# Patient Record
Sex: Female | Born: 1975
Health system: Southern US, Community
[De-identification: ages and names within clinical notes are randomized; demographics above are authoritative.]

## PROBLEM LIST (undated history)

## (undated) ENCOUNTER — Inpatient Hospital Stay (HOSPITAL_COMMUNITY): Payer: Self-pay

## (undated) DIAGNOSIS — T4145XA Adverse effect of unspecified anesthetic, initial encounter: Secondary | ICD-10-CM

## (undated) DIAGNOSIS — R112 Nausea with vomiting, unspecified: Secondary | ICD-10-CM

## (undated) DIAGNOSIS — E282 Polycystic ovarian syndrome: Secondary | ICD-10-CM

## (undated) DIAGNOSIS — D649 Anemia, unspecified: Secondary | ICD-10-CM

## (undated) DIAGNOSIS — Z9889 Other specified postprocedural states: Secondary | ICD-10-CM

## (undated) DIAGNOSIS — T8859XA Other complications of anesthesia, initial encounter: Secondary | ICD-10-CM

## (undated) DIAGNOSIS — O24419 Gestational diabetes mellitus in pregnancy, unspecified control: Secondary | ICD-10-CM

## (undated) HISTORY — PX: OTHER SURGICAL HISTORY: SHX169

## (undated) HISTORY — PX: DILATION AND CURETTAGE OF UTERUS: SHX78

## (undated) HISTORY — PX: HAND SURGERY: SHX662

## (undated) HISTORY — PX: WISDOM TOOTH EXTRACTION: SHX21

---

## 2001-05-26 ENCOUNTER — Other Ambulatory Visit: Admission: RE | Admit: 2001-05-26 | Discharge: 2001-05-26 | Payer: Self-pay | Admitting: Obstetrics and Gynecology

## 2002-11-01 ENCOUNTER — Other Ambulatory Visit: Admission: RE | Admit: 2002-11-01 | Discharge: 2002-11-01 | Payer: Self-pay | Admitting: Obstetrics and Gynecology

## 2003-12-22 ENCOUNTER — Ambulatory Visit (HOSPITAL_COMMUNITY): Admission: RE | Admit: 2003-12-22 | Discharge: 2003-12-22 | Payer: Self-pay | Admitting: Obstetrics and Gynecology

## 2006-11-28 ENCOUNTER — Ambulatory Visit: Payer: Self-pay | Admitting: Family Medicine

## 2006-11-28 DIAGNOSIS — K5909 Other constipation: Secondary | ICD-10-CM

## 2007-05-13 ENCOUNTER — Emergency Department (HOSPITAL_COMMUNITY): Admission: EM | Admit: 2007-05-13 | Discharge: 2007-05-13 | Payer: Self-pay | Admitting: Emergency Medicine

## 2007-12-15 ENCOUNTER — Other Ambulatory Visit: Admission: RE | Admit: 2007-12-15 | Discharge: 2007-12-15 | Payer: Self-pay | Admitting: Gynecology

## 2008-01-09 ENCOUNTER — Emergency Department (HOSPITAL_COMMUNITY): Admission: EM | Admit: 2008-01-09 | Discharge: 2008-01-09 | Payer: Self-pay | Admitting: Emergency Medicine

## 2008-01-21 ENCOUNTER — Emergency Department (HOSPITAL_COMMUNITY): Admission: EM | Admit: 2008-01-21 | Discharge: 2008-01-21 | Payer: Self-pay | Admitting: Family Medicine

## 2008-06-24 ENCOUNTER — Emergency Department (HOSPITAL_COMMUNITY): Admission: EM | Admit: 2008-06-24 | Discharge: 2008-06-24 | Payer: Self-pay | Admitting: Family Medicine

## 2008-08-30 ENCOUNTER — Ambulatory Visit (HOSPITAL_COMMUNITY): Admission: RE | Admit: 2008-08-30 | Discharge: 2008-08-30 | Payer: Self-pay | Admitting: Gynecology

## 2008-12-23 ENCOUNTER — Ambulatory Visit (HOSPITAL_COMMUNITY): Admission: RE | Admit: 2008-12-23 | Discharge: 2008-12-23 | Payer: Self-pay | Admitting: Obstetrics and Gynecology

## 2008-12-23 ENCOUNTER — Encounter (HOSPITAL_COMMUNITY): Payer: Self-pay | Admitting: Obstetrics and Gynecology

## 2009-02-22 ENCOUNTER — Ambulatory Visit (HOSPITAL_COMMUNITY): Admission: RE | Admit: 2009-02-22 | Discharge: 2009-02-22 | Payer: Self-pay | Admitting: Family Medicine

## 2009-02-23 ENCOUNTER — Emergency Department (HOSPITAL_COMMUNITY): Admission: EM | Admit: 2009-02-23 | Discharge: 2009-02-23 | Payer: Self-pay | Admitting: Emergency Medicine

## 2009-05-19 ENCOUNTER — Emergency Department (HOSPITAL_COMMUNITY): Admission: EM | Admit: 2009-05-19 | Discharge: 2009-05-19 | Payer: Self-pay | Admitting: Family Medicine

## 2010-04-06 ENCOUNTER — Encounter (HOSPITAL_COMMUNITY)
Admission: RE | Admit: 2010-04-06 | Discharge: 2010-04-06 | Disposition: A | Discharge status: Home / Self Care | Payer: 59 | Source: Ambulatory Visit | Attending: Obstetrics and Gynecology | Admitting: Obstetrics and Gynecology

## 2010-04-06 DIAGNOSIS — Z01812 Encounter for preprocedural laboratory examination: Secondary | ICD-10-CM | POA: Insufficient documentation

## 2010-04-06 LAB — CBC
HCT: 38.6 % (ref 36.0–46.0)
Hemoglobin: 12.6 g/dL (ref 12.0–15.0)
MCH: 28 pg (ref 26.0–34.0)
MCHC: 32.6 g/dL (ref 30.0–36.0)
MCV: 85.8 fL (ref 78.0–100.0)
Platelets: 302 10*3/uL (ref 150–400)
RBC: 4.5 MIL/uL (ref 3.87–5.11)
RDW: 12.3 % (ref 11.5–15.5)
WBC: 4 10*3/uL (ref 4.0–10.5)

## 2010-04-13 ENCOUNTER — Other Ambulatory Visit (HOSPITAL_COMMUNITY): Payer: Self-pay | Admitting: Obstetrics and Gynecology

## 2010-04-13 ENCOUNTER — Ambulatory Visit (HOSPITAL_COMMUNITY)
Admission: RE | Admit: 2010-04-13 | Discharge: 2010-04-13 | Disposition: A | Discharge status: Home / Self Care | Payer: 59 | Source: Ambulatory Visit | Attending: Obstetrics and Gynecology | Admitting: Obstetrics and Gynecology

## 2010-04-13 DIAGNOSIS — N840 Polyp of corpus uteri: Secondary | ICD-10-CM | POA: Insufficient documentation

## 2010-04-13 DIAGNOSIS — N938 Other specified abnormal uterine and vaginal bleeding: Secondary | ICD-10-CM | POA: Insufficient documentation

## 2010-04-13 DIAGNOSIS — N979 Female infertility, unspecified: Secondary | ICD-10-CM | POA: Insufficient documentation

## 2010-04-13 DIAGNOSIS — N949 Unspecified condition associated with female genital organs and menstrual cycle: Secondary | ICD-10-CM | POA: Insufficient documentation

## 2010-04-13 LAB — TYPE AND SCREEN
ABO/RH(D): O POS
Antibody Screen: NEGATIVE

## 2010-04-13 LAB — PREGNANCY, URINE: Preg Test, Ur: NEGATIVE

## 2010-04-18 NOTE — Op Note (Signed)
  NAMEKATHALINA, Andrea Young              ACCOUNT NO.:  [id number]  MEDICAL RECORD NO.:  [id number]           PATIENT TYPE:  O  LOCATION:  [facility]                          [facility]  [facility]  PHYSICIAN:  Zelphia Cairo, MD    DATE OF BIRTH:  [birth year]  DATE OF PROCEDURE:  04/13/2010 DATE OF DISCHARGE:                              OPERATIVE REPORT   PREOPERATIVE DIAGNOSES: 1. Infertility. 2. Irregular bleeding.  PROCEDURES: 1. Cervical block. 2. Hysteroscopy. 3. D and C.  SURGEON:  Zelphia Cairo, MD  ANESTHESIA:  General.  SPECIMENS: 1. Endometrial polyp. 2. Endometrial curettings to Pathology.  FLUID DEFICIT:  80 mL.  COMPLICATIONS:  None.  ESTIMATED BLOOD LOSS:  Minimal.  CONDITION:  Stable to recovery room.  DESCRIPTION OF PROCEDURE:  The patient was taken to the operating room. After informed consent was obtained, she was placed in the dorsal lithotomy position using Allen stirrups, prepped and draped in sterile fashion.  An in-and-out catheter was used to drain her bladder.  Bivalve speculum was placed in the vagina and 1 mL of 1% lidocaine was injected at 12 o'clock in the cervix.  Single-tooth tenaculum was placed on the anterior lip of the cervix.  A 9 mL of 1% lidocaine plain was then used to perform a paracervical block.  The cervix was then easily dilated using Pratt dilators and the hysteroscope was inserted into the endometrial cavity.  Large polypoid mass was noted on the anterior wall of the uterus.  No other masses were visualized.  Bilateral ostia were seen and appeared normal.  Hysteroscope was then removed.  Polyp forceps were introduced and polyp was grasped and easily removed from the cavity.  The hysteroscope was reinserted and no other masses or abnormalities were noted within the cavity.  A gentle curetting was then performed.  Specimen was placed on Telfa and passed off to be sent to Pathology.  Tenaculum was removed from the cervix.  The cervix  was hemostatic.  The speculum was removed. The patient was extubated and taken to the recovery room in stable condition.     Zelphia Cairo, MD     GA/MEDQ  D:  04/13/2010  T:  04/13/2010  Job:  [id number]  Electronically Signed by Zelphia Cairo MD on 04/18/2010 02:29:09 AM

## 2010-05-23 LAB — CBC
HCT: 35.1 % — ABNORMAL LOW (ref 36.0–46.0)
Hemoglobin: 11.5 g/dL — ABNORMAL LOW (ref 12.0–15.0)
MCHC: 32.8 g/dL (ref 30.0–36.0)
MCV: 81 fL (ref 78.0–100.0)
Platelets: 276 10*3/uL (ref 150–400)
RBC: 4.33 MIL/uL (ref 3.87–5.11)
RDW: 17.5 % — ABNORMAL HIGH (ref 11.5–15.5)
WBC: 4 10*3/uL (ref 4.0–10.5)

## 2010-05-23 LAB — TYPE AND SCREEN
ABO/RH(D): O POS
Antibody Screen: NEGATIVE

## 2010-05-23 LAB — COMPREHENSIVE METABOLIC PANEL
ALT: 21 U/L (ref 0–35)
AST: 24 U/L (ref 0–37)
Albumin: 3.7 g/dL (ref 3.5–5.2)
Alkaline Phosphatase: 68 U/L (ref 39–117)
BUN: 11 mg/dL (ref 6–23)
CO2: 27 mEq/L (ref 19–32)
Calcium: 8.9 mg/dL (ref 8.4–10.5)
Chloride: 107 mEq/L (ref 96–112)
Creatinine, Ser: 0.78 mg/dL (ref 0.4–1.2)
GFR calc Af Amer: >60 mL/min (ref >60)
GFR calc non Af Amer: >60 mL/min (ref >60)
Glucose, Bld: 114 mg/dL — ABNORMAL HIGH (ref 70–99)
Potassium: 3.6 mEq/L (ref 3.5–5.1)
Sodium: 139 mEq/L (ref 135–145)
Total Bilirubin: 0.4 mg/dL (ref 0.3–1.2)
Total Protein: 7.6 g/dL (ref 6.0–8.3)

## 2010-05-23 LAB — PREGNANCY, URINE: Preg Test, Ur: NEGATIVE

## 2010-05-23 LAB — ABO/RH: ABO/RH(D): O POS

## 2010-05-29 LAB — POCT PREGNANCY, URINE: Preg Test, Ur: NEGATIVE

## 2011-12-16 ENCOUNTER — Other Ambulatory Visit: Payer: Self-pay | Admitting: Gynecology

## 2012-01-20 ENCOUNTER — Encounter (HOSPITAL_COMMUNITY): Payer: Self-pay | Admitting: *Deleted

## 2012-01-27 ENCOUNTER — Encounter (HOSPITAL_COMMUNITY): Payer: Self-pay | Admitting: Pharmacist

## 2012-02-02 NOTE — H&P (Signed)
Andrea Young is an 36 y.o. female with irregular menses. Endometrial biopsy C/W benign polypoid tissue.  Subsequent SHG showed polypoid 8 mm intracavitary mass.   Pertinent Gynecological History: Menses: flow is excessive with use of many pads or tampons on heaviest days Bleeding: dysfunctional uterine bleeding Contraception: none DES exposure: denies Blood transfusions: none Sexually transmitted diseases: no past history Previous GYN Procedures: DNC  Last mammogram: not done Date: N/A Last pap: normal Date: 2013 OB History: G0, P0   Menstrual History: Menarche age: unknown Patient's last menstrual period was 12/26/2011.    Past Medical History  Diagnosis Date  . Complication of anesthesia   . Anemia     history  . PCOS (polycystic ovarian syndrome)     treatment with metformin    Past Surgical History  Procedure Date  . Bilateral foot surgery   . Hand surgery     as a child -tendons & ligaments repair  . Wisdom tooth extraction   . Dilation and curettage of uterus     x 2 for polyp removal    History reviewed. No pertinent family history.  Social History:  reports that she has never smoked. She has never used smokeless tobacco. She reports that she does not drink alcohol or use illicit drugs.  Allergies: No Known Allergies  No prescriptions prior to admission    Review of Systems  Constitutional: Negative for fever.    Height 5' 7.5" (1.715 m), weight 192 lb (87.091 kg), last menstrual period 12/26/2011. Physical Exam  Cardiovascular: Normal rate and regular rhythm.   Respiratory: Effort normal and breath sounds normal.    No results found for this or any previous visit (from the past 24 hour(s)).  No results found.  Assessment/Plan: 36 yo G0P0 with irregular menses and polypoid mass on SHG D/W patient hysteroscopy with resesctocope, D&C. Potential risks reviewed including infection, uterine perforation and organ damage, bleeding/transfusion  HIV/Hep, DVT/PE, pneumonia, IU synechiae and secondary infertility, laparoscopy, laparotomy, recurrent polyps, persistent or recurrent abnormal bleeding. Andrea Young II,Andrea Young E 02/02/2012, 9:45 PM

## 2012-02-03 ENCOUNTER — Ambulatory Visit (HOSPITAL_COMMUNITY)
Admission: RE | Admit: 2012-02-03 | Discharge: 2012-02-03 | Disposition: A | Discharge status: Home / Self Care | Payer: 59 | Source: Ambulatory Visit | Attending: Obstetrics and Gynecology | Admitting: Obstetrics and Gynecology

## 2012-02-03 ENCOUNTER — Ambulatory Visit (HOSPITAL_COMMUNITY): Payer: 59 | Admitting: Anesthesiology

## 2012-02-03 ENCOUNTER — Encounter (HOSPITAL_COMMUNITY)
Admission: RE | Disposition: A | Discharge status: Home / Self Care | Payer: Self-pay | Source: Ambulatory Visit | Attending: Obstetrics and Gynecology

## 2012-02-03 ENCOUNTER — Encounter (HOSPITAL_COMMUNITY): Payer: Self-pay | Admitting: Anesthesiology

## 2012-02-03 DIAGNOSIS — N926 Irregular menstruation, unspecified: Secondary | ICD-10-CM | POA: Insufficient documentation

## 2012-02-03 DIAGNOSIS — N840 Polyp of corpus uteri: Secondary | ICD-10-CM | POA: Insufficient documentation

## 2012-02-03 HISTORY — DX: Adverse effect of unspecified anesthetic, initial encounter: T41.45XA

## 2012-02-03 HISTORY — PX: DILATATION & CURRETTAGE/HYSTEROSCOPY WITH RESECTOCOPE: SHX5572

## 2012-02-03 HISTORY — DX: Other complications of anesthesia, initial encounter: T88.59XA

## 2012-02-03 HISTORY — DX: Polycystic ovarian syndrome: E28.2

## 2012-02-03 HISTORY — DX: Anemia, unspecified: D64.9

## 2012-02-03 LAB — CBC
HCT: 36 % (ref 36.0–46.0)
MCHC: 33.1 g/dL (ref 30.0–36.0)
MCV: 84.7 fL (ref 78.0–100.0)
Platelets: 278 10*3/uL (ref 150–400)
RDW: 12.9 % (ref 11.5–15.5)

## 2012-02-03 LAB — BASIC METABOLIC PANEL
BUN: 12 mg/dL (ref 6–23)
Creatinine, Ser: 0.78 mg/dL (ref 0.50–1.10)
GFR calc Af Amer: >90 mL/min (ref >90)
GFR calc non Af Amer: >90 mL/min (ref >90)
Potassium: 3.8 mEq/L (ref 3.5–5.1)

## 2012-02-03 LAB — HCG, SERUM, QUALITATIVE: Preg, Serum: NEGATIVE

## 2012-02-03 SURGERY — DILATATION & CURETTAGE/HYSTEROSCOPY WITH RESECTOCOPE
Anesthesia: General | Site: Vagina | Wound class: Clean Contaminated

## 2012-02-03 MED ORDER — LIDOCAINE HCL 1 % IJ SOLN
INTRAMUSCULAR | Status: DC | PRN
  Administered 2012-02-03: 20 mL

## 2012-02-03 MED ORDER — GLYCINE 1.5 % IR SOLN
Status: DC | PRN
  Administered 2012-02-03: 3000 mL

## 2012-02-03 MED ORDER — DEXAMETHASONE SODIUM PHOSPHATE 10 MG/ML IJ SOLN
INTRAMUSCULAR | Status: AC
  Filled 2012-02-03: qty 1

## 2012-02-03 MED ORDER — FENTANYL CITRATE 0.05 MG/ML IJ SOLN: 25.0000 ug | INTRAMUSCULAR | Status: DC | PRN

## 2012-02-03 MED ORDER — KETOROLAC TROMETHAMINE 30 MG/ML IJ SOLN
INTRAMUSCULAR | Status: DC | PRN
  Administered 2012-02-03: 30 mg via INTRAVENOUS

## 2012-02-03 MED ORDER — PROPOFOL 10 MG/ML IV EMUL
INTRAVENOUS | Status: DC | PRN
  Administered 2012-02-03: 20 mg via INTRAVENOUS
  Administered 2012-02-03: 180 mg via INTRAVENOUS

## 2012-02-03 MED ORDER — ONDANSETRON HCL 4 MG/2ML IJ SOLN
INTRAMUSCULAR | Status: DC | PRN
  Administered 2012-02-03: 4 mg via INTRAVENOUS

## 2012-02-03 MED ORDER — DEXAMETHASONE SODIUM PHOSPHATE 10 MG/ML IJ SOLN
INTRAMUSCULAR | Status: DC | PRN
  Administered 2012-02-03: 10 mg via INTRAVENOUS

## 2012-02-03 MED ORDER — MIDAZOLAM HCL 2 MG/2ML IJ SOLN
INTRAMUSCULAR | Status: AC
  Filled 2012-02-03: qty 2

## 2012-02-03 MED ORDER — FENTANYL CITRATE 0.05 MG/ML IJ SOLN
INTRAMUSCULAR | Status: AC
  Filled 2012-02-03: qty 5

## 2012-02-03 MED ORDER — BUPIVACAINE HCL (PF) 0.25 % IJ SOLN: INTRAMUSCULAR | Status: DC | PRN

## 2012-02-03 MED ORDER — LIDOCAINE HCL (CARDIAC) 20 MG/ML IV SOLN
INTRAVENOUS | Status: DC | PRN
  Administered 2012-02-03: 50 mg via INTRAVENOUS

## 2012-02-03 MED ORDER — LIDOCAINE HCL (CARDIAC) 20 MG/ML IV SOLN
INTRAVENOUS | Status: AC
  Filled 2012-02-03: qty 5

## 2012-02-03 MED ORDER — LACTATED RINGERS IV SOLN
INTRAVENOUS | Status: DC
  Administered 2012-02-03 (×2): via INTRAVENOUS

## 2012-02-03 MED ORDER — FENTANYL CITRATE 0.05 MG/ML IJ SOLN
INTRAMUSCULAR | Status: DC | PRN
  Administered 2012-02-03: 100 ug via INTRAVENOUS
  Administered 2012-02-03: 50 ug via INTRAVENOUS

## 2012-02-03 MED ORDER — SCOPOLAMINE 1 MG/3DAYS TD PT72
1.0000 | MEDICATED_PATCH | TRANSDERMAL | Status: DC
  Administered 2012-02-03: 1.5 mg via TRANSDERMAL

## 2012-02-03 MED ORDER — CEFAZOLIN SODIUM-DEXTROSE 2-3 GM-% IV SOLR
INTRAVENOUS | Status: AC
  Filled 2012-02-03: qty 50

## 2012-02-03 MED ORDER — SCOPOLAMINE 1 MG/3DAYS TD PT72
MEDICATED_PATCH | TRANSDERMAL | Status: AC
  Filled 2012-02-03: qty 1

## 2012-02-03 MED ORDER — ONDANSETRON HCL 4 MG/2ML IJ SOLN
INTRAMUSCULAR | Status: AC
  Filled 2012-02-03: qty 2

## 2012-02-03 MED ORDER — PROPOFOL 10 MG/ML IV EMUL
INTRAVENOUS | Status: AC
  Filled 2012-02-03: qty 20

## 2012-02-03 MED ORDER — KETOROLAC TROMETHAMINE 30 MG/ML IJ SOLN: 15.0000 mg | Freq: Once | INTRAMUSCULAR | Status: DC | PRN

## 2012-02-03 MED ORDER — CEFAZOLIN SODIUM-DEXTROSE 2-3 GM-% IV SOLR
2.0000 g | INTRAVENOUS | Status: AC
  Administered 2012-02-03: 2 g via INTRAVENOUS

## 2012-02-03 MED ORDER — MIDAZOLAM HCL 5 MG/5ML IJ SOLN
INTRAMUSCULAR | Status: DC | PRN
  Administered 2012-02-03: 2 mg via INTRAVENOUS

## 2012-02-03 MED ORDER — KETOROLAC TROMETHAMINE 30 MG/ML IJ SOLN
INTRAMUSCULAR | Status: AC
  Filled 2012-02-03: qty 1

## 2012-02-03 SURGICAL SUPPLY — 13 items
CANISTER SUCTION 2500CC (MISCELLANEOUS) ×2 IMPLANT
CATH ROBINSON RED A/P 16FR (CATHETERS) ×2 IMPLANT
CLOTH BEACON ORANGE TIMEOUT ST (SAFETY) ×2 IMPLANT
CONTAINER PREFILL 10% NBF 60ML (FORM) ×4 IMPLANT
DRESSING TELFA 8X3 (GAUZE/BANDAGES/DRESSINGS) ×2 IMPLANT
ELECT REM PT RETURN 9FT ADLT (ELECTROSURGICAL) ×2
ELECTRODE REM PT RTRN 9FT ADLT (ELECTROSURGICAL) ×1 IMPLANT
GLOVE BIO SURGEON STRL SZ8 (GLOVE) ×4 IMPLANT
GOWN STRL REIN XL XLG (GOWN DISPOSABLE) ×4 IMPLANT
LOOP ANGLED CUTTING 22FR (CUTTING LOOP) ×1 IMPLANT
PACK HYSTEROSCOPY LF (CUSTOM PROCEDURE TRAY) ×2 IMPLANT
PAD OB MATERNITY 4.3X12.25 (PERSONAL CARE ITEMS) ×2 IMPLANT
TOWEL OR 17X24 6PK STRL BLUE (TOWEL DISPOSABLE) ×4 IMPLANT

## 2012-02-03 NOTE — Progress Notes (Signed)
D/W patient procedure. All questions answered. No changes to H&P per patient history.

## 2012-02-03 NOTE — Brief Op Note (Signed)
02/03/2012  8:00 AM  PATIENT:  Massie Maroon  36 y.o. female  PRE-OPERATIVE DIAGNOSIS:  irregular menses  POST-OPERATIVE DIAGNOSIS:  irregular menses  PROCEDURE:  Hysteroscopy, dilation and currettage  SURGEON:  Surgeon(s) and Role:    * Leslie Andrea, MD - Primary  PHYSICIAN ASSISTANT:   ASSISTANTS: none   ANESTHESIA:   general  EBL:  Total I/O In: 600 [I.V.:600] Out: 210 [Urine:200; Blood:10]  BLOOD ADMINISTERED:none  DRAINS: none   LOCAL MEDICATIONS USED:  LIDOCAINE   SPECIMEN:  Source of Specimen:  endometrial currettings  DISPOSITION OF SPECIMEN:  PATHOLOGY  COUNTS:  YES  TOURNIQUET:  * No tourniquets in log *  DICTATION: .Other Dictation: Dictation Number [id number]  PLAN OF CARE: Discharge to home after PACU  PATIENT DISPOSITION:  PACU - hemodynamically stable.   Delay start of Pharmacological VTE agent (>24hrs) due to surgical blood loss or risk of bleeding: not applicable

## 2012-02-03 NOTE — Op Note (Signed)
NAMEKAYDIN, LABO              ACCOUNT NO.:  [id number]  MEDICAL RECORD NO.:  [id number]  LOCATION:  [facility]                          [facility]ACILITY:  [facility]  PHYSICIAN:  Guy Sandifer. Henderson Cloud, M.D. DATE OF BIRTH:  October 09, 1975  DATE OF PROCEDURE:  02/03/2012 DATE OF DISCHARGE:                              OPERATIVE REPORT   PREOPERATIVE DIAGNOSIS:  Irregular menses.  POSTOPERATIVE DIAGNOSIS:  Irregular menses.  PROCEDURE:  Hysteroscopy with dilatation and  curettage.  SURGEON:  Guy Sandifer. Henderson Cloud, M.D.  ANESTHESIA:  General with LMA, Dana Allan, MD  SPECIMENS:  Endometrial curettings to pathology.  ESTIMATED BLOOD LOSS:  Less than 100 mL.  I's and O's of distending media 110 mL deficit.  INDICATIONS AND CONSENT:  This patient is a 36 year old married black female, G0 P0, with irregular menses.  Ultrasound has been consistent with endometrial polypoid structure approximately 8 mm in size. Hysteroscopy with  resectoscope, dilatation,  curettage has been discussed.  Potential risks and complications have been reviewed preoperatively including but not limited to infection, uterine perforation, organ damage, bleeding requiring transfusion of blood products with HIV and hepatitis acquisition, DVT, PE, pneumonia, laparoscopy,  laparotomy, persistent or recurrent abnormal bleeding, intrauterine sneak and secondary infertility.  All questions have been answered and consent is signed on the chart.  FINDINGS:  There is an 8-10 mm polypoid type structure in the lower uterine segment just above the internal cervical os.  The lining of the uterine cavity is thick.  Fallopian tube, ostia identified bilaterally.  PROCEDURE IN DETAIL:  The patient was taken to the operating room where she was identified, placed in dorsal supine position.  General anesthesia was induced via LMA.  She was  placed in a dorsal lithotomy position.  Time-out was  undertaken.  She was prepped,  bladder straight catheterized  and draped in sterile fashion.  Bivalve speculum was placed in the vagina.  Anterior cervical lip was injected with 1% plain Xylocaine and grasped with single-tooth tenaculum.  Paracervical block was placed at 2, 4, 5, 7, 8, 10 o'clock positions with approximately 20 mL of the same solution.  Cervix was gently progressively dilated. Diagnostic hysteroscope was placed and advanced under direct visualization using distending media.  The above findings were noted. Hysteroscope was withdrawn and sharp curettage was carried out.  The polyp was seem to be obtained as well.  After curettage reinspection with the hysteroscope reveals the cavity to be clean.  Instruments were removed.  All counts were  correct.  Good hemostasis was noted, and the patient was awakened, taken to recovery room in stable condition.     Guy Sandifer Henderson Cloud, M.D.     JET/MEDQ  D:  02/03/2012  T:  02/03/2012  Job:  [id number]

## 2012-02-03 NOTE — Anesthesia Postprocedure Evaluation (Signed)
Anesthesia Post Note  Patient: Andrea Young  Procedure(s) Performed: Procedure(s) (LRB): DILATATION & CURETTAGE/HYSTEROSCOPY WITH RESECTOCOPE (N/A)  Anesthesia type: General  Patient location: PACU  Post pain: Pain level controlled  Post assessment: Post-op Vital signs reviewed  Last Vitals:  Filed Vitals:   02/03/12 0830  BP: 99/53  Pulse: 45  Temp:   Resp: 10    Post vital signs: Reviewed  Level of consciousness: sedated  Complications: No apparent anesthesia complicationsfj

## 2012-02-03 NOTE — Transfer of Care (Signed)
Immediate Anesthesia Transfer of Care Note  Patient: Andrea Young  Procedure(s) Performed: Procedure(s) (LRB) with comments: DILATATION & CURETTAGE/HYSTEROSCOPY WITH RESECTOCOPE (N/A) - no resection done  Patient Location: PACU  Anesthesia Type:General  Level of Consciousness: sedated  Airway & Oxygen Therapy: Patient Spontanous Breathing and Patient connected to nasal cannula oxygen  Post-op Assessment: Report given to PACU RN  Post vital signs: Reviewed and stable  Complications: No apparent anesthesia complications

## 2012-02-03 NOTE — Anesthesia Preprocedure Evaluation (Signed)
Anesthesia Evaluation  Patient identified by MRN, date of birth, ID band Patient awake    Reviewed: Allergy & Precautions, H&P , NPO status , Patient's Chart, lab work & pertinent test results, reviewed documented beta blocker date and time   History of Anesthesia Complications (+) PONV  Airway Mallampati: II TM Distance: >3 FB Neck ROM: full    Dental  (+) Teeth Intact   Pulmonary neg pulmonary ROS,  breath sounds clear to auscultation  Pulmonary exam normal       Cardiovascular negative cardio ROS  Rhythm:regular Rate:Normal     Neuro/Psych negative neurological ROS  negative psych ROS   GI/Hepatic negative GI ROS, Neg liver ROS,   Endo/Other  PCOS  Renal/GU negative Renal ROS  Female GU complaint     Musculoskeletal   Abdominal   Peds  Hematology negative hematology ROS (+)   Anesthesia Other Findings   Reproductive/Obstetrics negative OB ROS                           Anesthesia Physical Anesthesia Plan  ASA: II  Anesthesia Plan: General LMA   Post-op Pain Management:    Induction:   Airway Management Planned:   Additional Equipment:   Intra-op Plan:   Post-operative Plan:   Informed Consent: I have reviewed the patients History and Physical, chart, labs and discussed the procedure including the risks, benefits and alternatives for the proposed anesthesia with the patient or authorized representative who has indicated his/her understanding and acceptance.   Dental Advisory Given  Plan Discussed with: CRNA and Surgeon  Anesthesia Plan Comments:         Anesthesia Quick Evaluation

## 2012-02-04 ENCOUNTER — Encounter (HOSPITAL_COMMUNITY): Payer: Self-pay | Admitting: Obstetrics and Gynecology

## 2013-11-08 ENCOUNTER — Other Ambulatory Visit (HOSPITAL_COMMUNITY): Payer: Self-pay | Admitting: Gynecology

## 2013-11-08 DIAGNOSIS — Z3141 Encounter for fertility testing: Secondary | ICD-10-CM

## 2013-11-16 ENCOUNTER — Ambulatory Visit (HOSPITAL_COMMUNITY)
Admission: RE | Admit: 2013-11-16 | Discharge: 2013-11-16 | Disposition: A | Discharge status: Home / Self Care | Payer: 59 | Source: Ambulatory Visit | Attending: Gynecology | Admitting: Gynecology

## 2013-11-16 DIAGNOSIS — Z3141 Encounter for fertility testing: Secondary | ICD-10-CM

## 2013-11-16 DIAGNOSIS — N979 Female infertility, unspecified: Secondary | ICD-10-CM | POA: Diagnosis present

## 2013-11-16 MED ORDER — IOHEXOL 300 MG/ML  SOLN: 5.0000 mL | Freq: Once | INTRAMUSCULAR | Status: AC | PRN

## 2013-11-29 ENCOUNTER — Encounter (HOSPITAL_COMMUNITY): Payer: Self-pay | Admitting: Pharmacist

## 2013-11-29 ENCOUNTER — Encounter (HOSPITAL_COMMUNITY): Payer: Self-pay | Admitting: *Deleted

## 2013-12-01 MED ORDER — CEFOTETAN DISODIUM 2 G IJ SOLR
2.0000 g | INTRAMUSCULAR | Status: AC
  Administered 2013-12-02: 2 g via INTRAVENOUS
  Filled 2013-12-01: qty 2

## 2013-12-01 NOTE — H&P (Addendum)
Andrea Young is an 38 y.o. female with endometrial mass and infertility presents for surgical mngt.    Patient's last menstrual period was 11/06/2013.    Past Medical History  Diagnosis Date  . Complication of anesthesia   . Anemia     history  . PCOS (polycystic ovarian syndrome)     treatment with metformin  . PONV (postoperative nausea and vomiting)     Past Surgical History  Procedure Laterality Date  . Bilateral foot surgery    . Hand surgery      as a child -tendons & ligaments repair  . Wisdom tooth extraction    . Dilation and curettage of uterus      x 2 for polyp removal  . Dilatation & currettage/hysteroscopy with resectocope  02/03/2012    Procedure: DILATATION & CURETTAGE/HYSTEROSCOPY WITH RESECTOCOPE;  Surgeon: Leslie AndreaJames E Tomblin II, MD;  Location: [facility];  Service: Gynecology;  Laterality: N/A;  no resection done    No family history on file.  Social History:  reports that she has never smoked. She has never used smokeless tobacco. She reports that she does not drink alcohol or use illicit drugs.  Allergies: No Known Allergies  No prescriptions prior to admission    ROS  Last menstrual period 11/06/2013. Physical Exam AF, VSS Gen - NAD CV - RRR Lungs - clear Abd - soft, NT/ND Ext - NT PV - uterus mobile NT.  No adnexal tenderness  HSG - continues to have filling defect in left cornual on HSG.    Assessment/Plan: Infertiltiy and recurrent endometrial mass Hysteroscopy, D&C, resection of endometrial mass R/b/a discussed, questions answered, informed consent  [name] 12/01/2013, 6:56 PM

## 2013-12-02 ENCOUNTER — Encounter (HOSPITAL_COMMUNITY)
Admission: RE | Disposition: A | Discharge status: Home / Self Care | Payer: Self-pay | Source: Ambulatory Visit | Attending: Obstetrics and Gynecology

## 2013-12-02 ENCOUNTER — Ambulatory Visit (HOSPITAL_COMMUNITY)
Admission: RE | Admit: 2013-12-02 | Discharge: 2013-12-02 | Disposition: A | Discharge status: Home / Self Care | Payer: 59 | Source: Ambulatory Visit | Attending: Obstetrics and Gynecology | Admitting: Obstetrics and Gynecology

## 2013-12-02 ENCOUNTER — Encounter (HOSPITAL_COMMUNITY): Payer: 59 | Admitting: Anesthesiology

## 2013-12-02 ENCOUNTER — Ambulatory Visit (HOSPITAL_COMMUNITY): Payer: 59 | Admitting: Anesthesiology

## 2013-12-02 DIAGNOSIS — E282 Polycystic ovarian syndrome: Secondary | ICD-10-CM | POA: Diagnosis not present

## 2013-12-02 DIAGNOSIS — N840 Polyp of corpus uteri: Secondary | ICD-10-CM | POA: Diagnosis present

## 2013-12-02 HISTORY — DX: Other specified postprocedural states: R11.2

## 2013-12-02 HISTORY — DX: Other specified postprocedural states: Z98.890

## 2013-12-02 LAB — CBC
HCT: 37.2 % (ref 36.0–46.0)
HEMOGLOBIN: 12.6 g/dL (ref 12.0–15.0)
MCH: 28.7 pg (ref 26.0–34.0)
MCHC: 33.9 g/dL (ref 30.0–36.0)
MCV: 84.7 fL (ref 78.0–100.0)
Platelets: 245 10*3/uL (ref 150–400)
RBC: 4.39 MIL/uL (ref 3.87–5.11)
RDW: 13.1 % (ref 11.5–15.5)
WBC: 3.7 10*3/uL — ABNORMAL LOW (ref 4.0–10.5)

## 2013-12-02 LAB — PREGNANCY, URINE: Preg Test, Ur: NEGATIVE

## 2013-12-02 SURGERY — DILATATION & CURETTAGE/HYSTEROSCOPY WITH MYOSURE
Anesthesia: General

## 2013-12-02 MED ORDER — FENTANYL CITRATE 0.05 MG/ML IJ SOLN: 25.0000 ug | INTRAMUSCULAR | Status: DC | PRN

## 2013-12-02 MED ORDER — LIDOCAINE HCL 1 % IJ SOLN
INTRAMUSCULAR | Status: DC | PRN
  Administered 2013-12-02: 10 mL

## 2013-12-02 MED ORDER — ONDANSETRON HCL 4 MG/2ML IJ SOLN
INTRAMUSCULAR | Status: AC
  Filled 2013-12-02: qty 2

## 2013-12-02 MED ORDER — MIDAZOLAM HCL 2 MG/2ML IJ SOLN: 0.5000 mg | Freq: Once | INTRAMUSCULAR | Status: DC | PRN

## 2013-12-02 MED ORDER — FENTANYL CITRATE 0.05 MG/ML IJ SOLN
INTRAMUSCULAR | Status: DC | PRN
  Administered 2013-12-02: 100 ug via INTRAVENOUS

## 2013-12-02 MED ORDER — SCOPOLAMINE 1 MG/3DAYS TD PT72
MEDICATED_PATCH | TRANSDERMAL | Status: AC
  Administered 2013-12-02: 1.5 mg via TRANSDERMAL
  Filled 2013-12-02: qty 1

## 2013-12-02 MED ORDER — MIDAZOLAM HCL 2 MG/2ML IJ SOLN
INTRAMUSCULAR | Status: DC | PRN
  Administered 2013-12-02: 2 mg via INTRAVENOUS

## 2013-12-02 MED ORDER — LIDOCAINE HCL (CARDIAC) 20 MG/ML IV SOLN
INTRAVENOUS | Status: AC
  Filled 2013-12-02: qty 5

## 2013-12-02 MED ORDER — LACTATED RINGERS IV SOLN: INTRAVENOUS | Status: DC

## 2013-12-02 MED ORDER — FENTANYL CITRATE 0.05 MG/ML IJ SOLN
INTRAMUSCULAR | Status: AC
  Filled 2013-12-02: qty 5

## 2013-12-02 MED ORDER — SCOPOLAMINE 1 MG/3DAYS TD PT72: 1.0000 | MEDICATED_PATCH | Freq: Once | TRANSDERMAL | Status: DC

## 2013-12-02 MED ORDER — LACTATED RINGERS IV SOLN
INTRAVENOUS | Status: DC
  Administered 2013-12-02: 09:00:00 via INTRAVENOUS

## 2013-12-02 MED ORDER — PROPOFOL 10 MG/ML IV EMUL
INTRAVENOUS | Status: AC
  Filled 2013-12-02: qty 20

## 2013-12-02 MED ORDER — MEPERIDINE HCL 25 MG/ML IJ SOLN: 6.2500 mg | INTRAMUSCULAR | Status: DC | PRN

## 2013-12-02 MED ORDER — LIDOCAINE HCL (CARDIAC) 20 MG/ML IV SOLN
INTRAVENOUS | Status: DC | PRN
  Administered 2013-12-02: 50 mg via INTRAVENOUS

## 2013-12-02 MED ORDER — HYDROCODONE-IBUPROFEN 7.5-200 MG PO TABS: 1.0000 | ORAL_TABLET | Freq: Three times a day (TID) | ORAL | Status: DC | PRN

## 2013-12-02 MED ORDER — LIDOCAINE HCL 1 % IJ SOLN
INTRAMUSCULAR | Status: AC
  Filled 2013-12-02: qty 20

## 2013-12-02 MED ORDER — KETOROLAC TROMETHAMINE 30 MG/ML IJ SOLN
INTRAMUSCULAR | Status: AC
  Filled 2013-12-02: qty 1

## 2013-12-02 MED ORDER — ONDANSETRON HCL 4 MG/2ML IJ SOLN
INTRAMUSCULAR | Status: DC | PRN
  Administered 2013-12-02: 4 mg via INTRAVENOUS

## 2013-12-02 MED ORDER — SCOPOLAMINE 1 MG/3DAYS TD PT72
1.0000 | MEDICATED_PATCH | Freq: Once | TRANSDERMAL | Status: DC
  Administered 2013-12-02: 1.5 mg via TRANSDERMAL

## 2013-12-02 MED ORDER — GLYCOPYRROLATE 0.2 MG/ML IJ SOLN
INTRAMUSCULAR | Status: DC | PRN
  Administered 2013-12-02: 0.2 mg via INTRAVENOUS

## 2013-12-02 MED ORDER — DEXAMETHASONE SODIUM PHOSPHATE 4 MG/ML IJ SOLN
INTRAMUSCULAR | Status: AC
  Filled 2013-12-02: qty 1

## 2013-12-02 MED ORDER — PROMETHAZINE HCL 25 MG/ML IJ SOLN: 6.2500 mg | INTRAMUSCULAR | Status: DC | PRN

## 2013-12-02 MED ORDER — DEXAMETHASONE SODIUM PHOSPHATE 10 MG/ML IJ SOLN
INTRAMUSCULAR | Status: DC | PRN
  Administered 2013-12-02: 4 mg via INTRAVENOUS

## 2013-12-02 MED ORDER — KETOROLAC TROMETHAMINE 30 MG/ML IJ SOLN
INTRAMUSCULAR | Status: DC | PRN
  Administered 2013-12-02: 30 mg via INTRAVENOUS

## 2013-12-02 MED ORDER — KETOROLAC TROMETHAMINE 30 MG/ML IJ SOLN: 15.0000 mg | Freq: Once | INTRAMUSCULAR | Status: DC | PRN

## 2013-12-02 MED ORDER — PROPOFOL 10 MG/ML IV BOLUS
INTRAVENOUS | Status: DC | PRN
  Administered 2013-12-02: 200 mg via INTRAVENOUS

## 2013-12-02 MED ORDER — MIDAZOLAM HCL 2 MG/2ML IJ SOLN
INTRAMUSCULAR | Status: AC
  Filled 2013-12-02: qty 2

## 2013-12-02 SURGICAL SUPPLY — 25 items
ABLATOR ENDOMETRIAL BIPOLAR (ABLATOR) IMPLANT
CANISTER SUCT 3000ML (MISCELLANEOUS) ×3 IMPLANT
CATH ROBINSON RED A/P 16FR (CATHETERS) ×3 IMPLANT
CATH THERMACHOICE III (CATHETERS) IMPLANT
CLOTH BEACON ORANGE TIMEOUT ST (SAFETY) ×3 IMPLANT
CONTAINER PREFILL 10% NBF 60ML (FORM) ×6 IMPLANT
DEVICE MYOSURE CLASSIC (MISCELLANEOUS) IMPLANT
DEVICE MYOSURE LITE (MISCELLANEOUS) IMPLANT
DRAPE HYSTEROSCOPY (DRAPE) ×3 IMPLANT
ELECT REM PT RETURN 9FT ADLT (ELECTROSURGICAL) ×3
ELECTRODE REM PT RTRN 9FT ADLT (ELECTROSURGICAL) ×1 IMPLANT
FILTER ARTHROSCOPY CONVERTOR (FILTER) ×3 IMPLANT
GLOVE BIO SURGEON STRL SZ 6.5 (GLOVE) ×2 IMPLANT
GLOVE BIO SURGEONS STRL SZ 6.5 (GLOVE) ×1
GLOVE BIOGEL PI IND STRL 7.0 (GLOVE) ×1 IMPLANT
GLOVE BIOGEL PI INDICATOR 7.0 (GLOVE) ×2
GOWN STRL REUS W/TWL LRG LVL3 (GOWN DISPOSABLE) ×6 IMPLANT
LOOP ANGLED CUTTING 22FR (CUTTING LOOP) ×3 IMPLANT
PACK VAGINAL MINOR WOMEN LF (CUSTOM PROCEDURE TRAY) ×3 IMPLANT
PAD OB MATERNITY 4.3X12.25 (PERSONAL CARE ITEMS) ×3 IMPLANT
SEAL ROD LENS SCOPE MYOSURE (ABLATOR) ×3 IMPLANT
SET TUBING HYSTEROSCOPY 2 NDL (TUBING) IMPLANT
TOWEL OR 17X24 6PK STRL BLUE (TOWEL DISPOSABLE) ×6 IMPLANT
TUBE HYSTEROSCOPY W Y-CONNECT (TUBING) IMPLANT
WATER STERILE IRR 1000ML POUR (IV SOLUTION) ×3 IMPLANT

## 2013-12-02 NOTE — Op Note (Signed)
NAMJulianne Handler:  Steelman, Koren              ACCOUNT NO.:  [id number]  MEDICAL RECORD NO.:  [id number]  LOCATION:  [facility]                          [facility]  PHYSICIAN:  Zelphia CairoGretchen Breyer Tejera, MD    DATE OF BIRTH:  March 08, 1975  DATE OF PROCEDURE:  12/02/2013 DATE OF DISCHARGE:                              OPERATIVE REPORT   PREOPERATIVE DIAGNOSES: 1. Endometrial mass. 2. Infertility.  POSTOPERATIVE DIAGNOSES: 1. Endometrial mass. 2. Infertility, pathology pending.  PROCEDURE: 1. Paracervical block. 2. Hysteroscopy. 3. Resection of endometrial polyp.  SURGEON:  Zelphia CairoGretchen Johnnisha Forton, MD  ANESTHESIA:  General.  COMPLICATIONS:  None.  SPECIMEN:  Endometrial polyp.  CONDITION:  Stable to recovery room.  DESCRIPTION OF PROCEDURE:  The patient was taken to the operating room, where she was placed on dorsal lithotomy position using Allen stirrups. She was given general anesthesia.  Prepped and draped in sterile fashion.  In and out catheter was used to drain her bladder for an unmeasured amount of urine.  Bivalve speculum was placed in the vagina and a paracervical block was performed.  Single-tooth tenaculum was attached to the anterior lip of the cervix.  Pratt dilators was used to dilate the cervix and the hysteroscope was inserted.  Polypoid mass was noted in the left cornual region.  MyoSure was inserted and the polyp was resected to the base.  No other abnormalities were noted throughout the cavity.  All instruments were removed.  Tenaculum was removed from the cervix.  The cervix was hemostatic.  Speculum was removed.  She was extubated and taken to the recovery room in stable condition.  Sponge, lap, needle, and instrument counts were correct x2.     Zelphia CairoGretchen Destiny Trickey, MD     GA/MEDQ  D:  12/02/2013  T:  12/02/2013  Job:  [id number]

## 2013-12-02 NOTE — Transfer of Care (Signed)
Immediate Anesthesia Transfer of Care Note  Patient: Andrea Young  Procedure(s) Performed: Procedure(s): DILATATION & CURETTAGE/HYSTEROSCOPY WITH MYOSURE ABLATION (N/A)  Patient Location: PACU  Anesthesia Type:General  Level of Consciousness: awake, alert , oriented and patient cooperative  Airway & Oxygen Therapy: Patient Spontanous Breathing and Patient connected to nasal cannula oxygen  Post-op Assessment: Report given to PACU RN and Post -op Vital signs reviewed and stable  Post vital signs: Reviewed and stable  Complications: No apparent anesthesia complications

## 2013-12-02 NOTE — Anesthesia Preprocedure Evaluation (Signed)
Anesthesia Evaluation  Patient identified by MRN, date of birth, ID band Patient awake    Reviewed: Allergy & Precautions, H&P , Patient's Chart, lab work & pertinent test results, reviewed documented beta blocker date and time   History of Anesthesia Complications (+) PONV and history of anesthetic complications  Airway Mallampati: II TM Distance: >3 FB Neck ROM: full    Dental   Pulmonary  breath sounds clear to auscultation        Cardiovascular Exercise Tolerance: Good Rhythm:regular Rate:Normal     Neuro/Psych negative psych ROS   GI/Hepatic   Endo/Other    Renal/GU      Musculoskeletal   Abdominal   Peds  Hematology  (+) anemia ,   Anesthesia Other Findings   Reproductive/Obstetrics                           Anesthesia Physical Anesthesia Plan  ASA: II  Anesthesia Plan: General LMA   Post-op Pain Management:    Induction:   Airway Management Planned:   Additional Equipment:   Intra-op Plan:   Post-operative Plan:   Informed Consent: I have reviewed the patients History and Physical, chart, labs and discussed the procedure including the risks, benefits and alternatives for the proposed anesthesia with the patient or authorized representative who has indicated his/her understanding and acceptance.   Dental Advisory Given  Plan Discussed with: CRNA, Surgeon and Anesthesiologist  Anesthesia Plan Comments:         Anesthesia Quick Evaluation

## 2013-12-02 NOTE — Discharge Instructions (Signed)
[facility] office 2-3 weeks for postop appointment.  Call the office [phone] for an appointment.  MAY TAKE IBUPROFEN (MOTRIN, ADVIL) OR ALEVE AFTER 5:00 PM FOR CRAMPS!!!  Personal Hygiene: Use pads not tampons x 1week You may shower, no tub baths or pools for 2-3 weeks Wipe from front to back when using restroom  Activity: Do not drive or operate any equipment for 24 hrs.   Do not rest in bed all day Walking is encouraged Walk up and down stairs slowly You may return to your normal activity in 1-2 days  Sexual Activity:  No intercourse for 2 weeks after the procedure.  Diet: Eat a light meal as desired this evening.  You may resume your usual diet tomorrow.  Return to work:  You may resume your work activities after 1-2 days  What to expect:  Expect to have vaginal bleeding/discharge for 2-3 days and spotting for 10-14 days.  It is not unusual to have soreness for 1-2 weeks.  You may have a slight burning sensation when you urinate for the first few days.  You may start your menses in 2-6 weeks.  Mild cramps may continue for a couple of days.    Call your doctor:   Excessive bleeding, saturating a pad every hour Inability to urinate 6 hours after discharge Pain not relieved with pain medications Fever of 100.4 or greater

## 2013-12-02 NOTE — Anesthesia Postprocedure Evaluation (Signed)
  Anesthesia Post Note  Patient: Andrea MaroonLachina M Macbride  Procedure(s) Performed: Procedure(s) (LRB): DILATATION & CURETTAGE/HYSTEROSCOPY WITH MYOSURE ABLATION (N/A)  Anesthesia type: GA  Patient location: PACU  Post pain: Pain level controlled  Post assessment: Post-op Vital signs reviewed  Last Vitals:  Filed Vitals:   12/02/13 1145  BP: 100/61  Pulse: 50  Temp:   Resp: 12    Post vital signs: Reviewed  Level of consciousness: sedated  Complications: No apparent anesthesia complications

## 2013-12-12 ENCOUNTER — Emergency Department (HOSPITAL_COMMUNITY)
Admission: EM | Admit: 2013-12-12 | Discharge: 2013-12-12 | Disposition: A | Discharge status: Home / Self Care | Payer: 59 | Source: Home / Self Care | Attending: Family Medicine | Admitting: Family Medicine

## 2013-12-12 ENCOUNTER — Encounter (HOSPITAL_COMMUNITY): Payer: Self-pay | Admitting: Emergency Medicine

## 2013-12-12 DIAGNOSIS — R002 Palpitations: Secondary | ICD-10-CM

## 2013-12-12 DIAGNOSIS — R300 Dysuria: Secondary | ICD-10-CM

## 2013-12-12 LAB — POCT URINALYSIS DIP (DEVICE)
Bilirubin Urine: NEGATIVE
GLUCOSE, UA: NEGATIVE mg/dL
HGB URINE DIPSTICK: NEGATIVE
KETONES UR: NEGATIVE mg/dL
Leukocytes, UA: NEGATIVE
Nitrite: NEGATIVE
Protein, ur: NEGATIVE mg/dL
SPECIFIC GRAVITY, URINE: 1.02 (ref 1.005–1.030)
UROBILINOGEN UA: 0.2 mg/dL (ref 0.0–1.0)
pH: 7 (ref 5.0–8.0)

## 2013-12-12 NOTE — Discharge Instructions (Signed)
See your doctor if further problems. °

## 2013-12-12 NOTE — ED Notes (Signed)
C/o vaginal irritation after having a hysteroscopy.  Urinary urgency and frequency.

## 2013-12-12 NOTE — ED Provider Notes (Signed)
CSN: [id number]     Arrival date & time 12/12/13  1225 History   First MD Initiated Contact with Patient 12/12/13 1249     Chief Complaint  Patient presents with  . Vaginal Itching  . Urinary Tract Infection   (Consider location/radiation/quality/duration/timing/severity/associated sxs/prior Treatment) Patient is a 38 y.o. female presenting with palpitations. The history is provided by the patient.  Palpitations Palpitations quality:  Unable to specify Onset quality:  Sudden Timing:  Sporadic Progression:  Unchanged (3 episodes since yest,catching feeling in chest.) Chronicity:  New Worsened by:  Nothing tried Associated symptoms comment:  S/p hysteroscopy 1 week ag.   Past Medical History  Diagnosis Date  . Complication of anesthesia   . Anemia     history  . PCOS (polycystic ovarian syndrome)     treatment with metformin  . PONV (postoperative nausea and vomiting)    Past Surgical History  Procedure Laterality Date  . Bilateral foot surgery    . Hand surgery      as a child -tendons & ligaments repair  . Wisdom tooth extraction    . Dilation and curettage of uterus      x 2 for polyp removal  . Dilatation & currettage/hysteroscopy with resectocope  02/03/2012    Procedure: DILATATION & CURETTAGE/HYSTEROSCOPY WITH RESECTOCOPE;  Surgeon: Leslie AndreaJames E Tomblin II, MD;  Location: [facility];  Service: Gynecology;  Laterality: N/A;  no resection done   History reviewed. No pertinent family history. History  Substance Use Topics  . Smoking status: Never Smoker   . Smokeless tobacco: Never Used  . Alcohol Use: No   OB History   Grav Para Term Preterm Abortions TAB SAB Ect Mult Living                 Review of Systems  Constitutional: Negative.   Cardiovascular: Positive for palpitations.  Genitourinary: Positive for dysuria and frequency.    Allergies  Review of patient's allergies indicates no known allergies.  Home Medications   Prior to Admission medications    Medication Sig Start Date End Date Taking? Authorizing Provider  Biotin 10 MG TABS Take 1 tablet by mouth daily.   Yes Historical Provider, MD  folic acid (FOLVITE) 400 MCG tablet Take 400 mcg by mouth daily.   Yes Historical Provider, MD  Inositol Niacinate 500 MG TABS Take 1 tablet by mouth daily.   Yes Historical Provider, MD  Lysine 1000 MG TABS Take 1 tablet by mouth daily.   Yes Historical Provider, MD  metFORMIN (GLUCOPHAGE) 500 MG tablet Take 500 mg by mouth 2 (two) times daily with a meal.   Yes Historical Provider, MD  vitamin B-12 (CYANOCOBALAMIN) 500 MCG tablet Take 500 mcg by mouth daily.   Yes Historical Provider, MD  CHROMIUM PO Take 1 tablet by mouth daily.    Historical Provider, MD  HYDROcodone-ibuprofen (VICOPROFEN) 7.5-200 MG per tablet Take 1 tablet by mouth every 8 (eight) hours as needed for moderate pain. 12/02/13   Zelphia CairoGretchen Adkins, MD   BP 110/74  Pulse 59  Temp(Src) 97.6 F (36.4 C) (Oral)  Resp 16  SpO2 99%  LMP 11/06/2013 Physical Exam  Nursing note and vitals reviewed. Constitutional: She is oriented to person, place, and time. She appears well-developed and well-nourished. No distress.  Neck: Normal range of motion. Neck supple. No thyromegaly present.  Cardiovascular: Regular rhythm, normal heart sounds and intact distal pulses.   Pulmonary/Chest: Effort normal and breath sounds normal.  Lymphadenopathy:  She has no cervical adenopathy.  Neurological: She is alert and oriented to person, place, and time.  Skin: Skin is warm and dry.    ED Course  Procedures (including critical care time) Labs Review Labs Reviewed  POCT URINALYSIS DIP (DEVICE)    Imaging Review No results found.   MDM   1. Intermittent palpitations   2. Dysuria        Linna HoffJames D Kadelyn Dimascio, MD 12/12/13 1324

## 2014-01-11 ENCOUNTER — Other Ambulatory Visit (HOSPITAL_COMMUNITY): Payer: Self-pay | Admitting: Gynecology

## 2014-01-11 DIAGNOSIS — Z3141 Encounter for fertility testing: Secondary | ICD-10-CM

## 2014-01-18 ENCOUNTER — Ambulatory Visit (HOSPITAL_COMMUNITY)
Admission: RE | Admit: 2014-01-18 | Discharge: 2014-01-18 | Disposition: A | Discharge status: Home / Self Care | Payer: 59 | Source: Ambulatory Visit | Attending: Gynecology | Admitting: Gynecology

## 2014-01-18 DIAGNOSIS — Z3141 Encounter for fertility testing: Secondary | ICD-10-CM | POA: Diagnosis not present

## 2014-01-18 MED ORDER — IOHEXOL 300 MG/ML  SOLN
10.0000 mL | Freq: Once | INTRAMUSCULAR | Status: AC | PRN
  Administered 2014-01-18: 20 mL

## 2014-05-21 ENCOUNTER — Emergency Department (HOSPITAL_BASED_OUTPATIENT_CLINIC_OR_DEPARTMENT_OTHER)
Admission: EM | Admit: 2014-05-21 | Discharge: 2014-05-21 | Disposition: A | Discharge status: Home / Self Care | Payer: 59 | Attending: Emergency Medicine | Admitting: Emergency Medicine

## 2014-05-21 ENCOUNTER — Encounter (HOSPITAL_BASED_OUTPATIENT_CLINIC_OR_DEPARTMENT_OTHER): Payer: Self-pay | Admitting: Emergency Medicine

## 2014-05-21 DIAGNOSIS — H9209 Otalgia, unspecified ear: Secondary | ICD-10-CM | POA: Insufficient documentation

## 2014-05-21 DIAGNOSIS — O99511 Diseases of the respiratory system complicating pregnancy, first trimester: Secondary | ICD-10-CM | POA: Diagnosis not present

## 2014-05-21 DIAGNOSIS — J029 Acute pharyngitis, unspecified: Secondary | ICD-10-CM | POA: Insufficient documentation

## 2014-05-21 DIAGNOSIS — O99011 Anemia complicating pregnancy, first trimester: Secondary | ICD-10-CM | POA: Insufficient documentation

## 2014-05-21 DIAGNOSIS — Z79899 Other long term (current) drug therapy: Secondary | ICD-10-CM | POA: Diagnosis not present

## 2014-05-21 DIAGNOSIS — Z8639 Personal history of other endocrine, nutritional and metabolic disease: Secondary | ICD-10-CM | POA: Insufficient documentation

## 2014-05-21 DIAGNOSIS — R5383 Other fatigue: Secondary | ICD-10-CM | POA: Insufficient documentation

## 2014-05-21 DIAGNOSIS — O9989 Other specified diseases and conditions complicating pregnancy, childbirth and the puerperium: Secondary | ICD-10-CM | POA: Insufficient documentation

## 2014-05-21 DIAGNOSIS — Z3A01 Less than 8 weeks gestation of pregnancy: Secondary | ICD-10-CM | POA: Insufficient documentation

## 2014-05-21 DIAGNOSIS — M791 Myalgia: Secondary | ICD-10-CM | POA: Diagnosis not present

## 2014-05-21 DIAGNOSIS — R6889 Other general symptoms and signs: Secondary | ICD-10-CM

## 2014-05-21 LAB — RAPID STREP SCREEN (MED CTR MEBANE ONLY): Streptococcus, Group A Screen (Direct): NEGATIVE

## 2014-05-21 MED ORDER — OSELTAMIVIR PHOSPHATE 75 MG PO CAPS: 75.0000 mg | ORAL_CAPSULE | Freq: Two times a day (BID) | ORAL | Status: DC

## 2014-05-21 NOTE — ED Notes (Signed)
Pt reports body aches chills and sore throat tylenol 1000 mg

## 2014-05-21 NOTE — Discharge Instructions (Signed)
Continue Tylenol for pain and for fever. Rest. Drink plenty of fluids. If her symptoms are worsening take Tamiflu, follow-up with your primary care doctor.  Pregnancy and Influenza Influenza, also called the flu, is an infection of the respiratory tract. If you are pregnant, you are more likely to catch the flu. You are also more likely to have a more serious case of the flu. This is because pregnancy lowers your body's ability to fight off infections (it weakens your immune system). It also puts additional stress on your heart and lungs, which makes you more likely to have complications. Having a bad case of the flu, especially with a high fever, can be dangerous for your developing baby. It can cause you to go into early labor. HOW DO PEOPLE GET THE FLU? The flu is caused by the influenza virus. This virus is common every year in the fall and winter. It spreads when virus particles get passed from person to person. You can get the virus if you are near a sick person who is coughing or sneezing. You can also get the virus if you touch something that has the virus on it and then touch your face. HOW CAN I PROTECT MYSELF AGAINST THE FLU?  Get a flu shot. The best way to prevent the flu is to get a flu shot before flu season starts. The flu shot is not dangerous for your developing baby. It may even help protect your baby from the flu for up to 6 months after birth. The flu shot is one type of flu vaccine. Another type is a nasal spray vaccine. Do not get the nasal spray vaccine. It is not approved for pregnancy.  Do not come in close contact with sick people.  Do not share food, drinks, or utensils with other people.  Wash your hands often. Use hand sanitizer when soap and water are not available. WHAT SHOULD I DO IF I HAVE FLU SYMPTOMS? If you have any flu symptoms, call your health care provider right away. Flu symptoms include:  Fever or chills.  Muscle aches.  Headache.  Sore  throat.  Nasal congestion.  Cough.  Feeling tired.  Loss of appetite.  Vomiting.  Diarrhea. You may be able to take an antiviral medicine to keep the flu from becoming severe and to shorten how long it lasts. WHAT SHOULD I DO AT HOME IF I AM DIAGNOSED WITH THE FLU?  Do not take any medicine, including cold or flu medicine, unless directed by your health care provider.  If you take antiviral medicine, make sure you finish it even if you start to feel better.  Drink enough fluid to keep your urine clear or pale yellow.  Get plenty of rest. WHEN WOULD I SEEK IMMEDIATE MEDICAL CARE IF I HAVE THE FLU?  You have trouble breathing.  You have chest pain.  You begin to have labor pains.  You have a high fever that does not go down after you take medicine.  You do not feel your baby move.  You have diarrhea or vomiting that will not go away. Document Released: 12/08/2007 Document Revised: 02/09/2013 Document Reviewed: 01/01/2013 [facility] Patient Information 2015 [place], [place]. This information is not intended to replace advice given to you by your health care provider. Make sure you discuss any questions you have with your health care provider.

## 2014-05-21 NOTE — ED Provider Notes (Signed)
CSN: [id number]     Arrival date & time 05/21/14  1857 History   First MD Initiated Contact with Patient 05/21/14 1947     Chief Complaint  Patient presents with  . Sore Throat     (Consider location/radiation/quality/duration/timing/severity/associated sxs/prior Treatment) HPI Andrea Young is a 39 y.o. female with history of polycystic ovarian syndrome, presents to emergency department complaining of so throat, body aches, chills that started today. Patient states she was around some friends who had flu last week. She states she is [redacted] weeks pregnant so she became concerned and wanted to come and get treated. She denies taking her temperature at home. She denies any cough at this time. Mild congestion. She states she is having some pressure in her right ear. She denies any chest pain, abdominal pain, vaginal discharge or bleeding. She has been taking Tylenol at home.   Past Medical History  Diagnosis Date  . Complication of anesthesia   . Anemia     history  . PCOS (polycystic ovarian syndrome)     treatment with metformin  . PONV (postoperative nausea and vomiting)    Past Surgical History  Procedure Laterality Date  . Bilateral foot surgery    . Hand surgery      as a child -tendons & ligaments repair  . Wisdom tooth extraction    . Dilation and curettage of uterus      x 2 for polyp removal  . Dilatation & currettage/hysteroscopy with resectocope  02/03/2012    Procedure: DILATATION & CURETTAGE/HYSTEROSCOPY WITH RESECTOCOPE;  Surgeon: Leslie Andrea, MD;  Location: [facility];  Service: Gynecology;  Laterality: N/A;  no resection done   History reviewed. No pertinent family history. History  Substance Use Topics  . Smoking status: Never Smoker   . Smokeless tobacco: Never Used  . Alcohol Use: No   OB History    No data available     Review of Systems  Constitutional: Positive for chills and fatigue. Negative for fever.  HENT: Positive for congestion, ear pain and  sore throat.   Respiratory: Negative for cough, chest tightness and shortness of breath.   Cardiovascular: Negative for chest pain, palpitations and leg swelling.  Gastrointestinal: Negative for nausea, vomiting, abdominal pain and diarrhea.  Genitourinary: Negative for dysuria, flank pain, vaginal bleeding, vaginal discharge, vaginal pain and pelvic pain.  Musculoskeletal: Positive for myalgias. Negative for neck pain and neck stiffness.  Skin: Negative for rash.  Neurological: Negative for dizziness, weakness and headaches.  All other systems reviewed and are negative.     Allergies  Review of patient's allergies indicates no known allergies.  Home Medications   Prior to Admission medications   Medication Sig Start Date End Date Taking? Authorizing Provider  Prenatal MV-Min-Fe Fum-FA-DHA (PRENATAL 1 PO) Take by mouth.   Yes Historical Provider, MD  PROGESTERONE IM Inject into the muscle.   Yes Historical Provider, MD  Biotin 10 MG TABS Take 1 tablet by mouth daily.    Historical Provider, MD  CHROMIUM PO Take 1 tablet by mouth daily.    Historical Provider, MD  folic acid (FOLVITE) 400 MCG tablet Take 400 mcg by mouth daily.    Historical Provider, MD  HYDROcodone-ibuprofen (VICOPROFEN) 7.5-200 MG per tablet Take 1 tablet by mouth every 8 (eight) hours as needed for moderate pain. 12/02/13   Zelphia Cairo, MD  Inositol Niacinate 500 MG TABS Take 1 tablet by mouth daily.    Historical Provider, MD  Lysine  1000 MG TABS Take 1 tablet by mouth daily.    Historical Provider, MD  metFORMIN (GLUCOPHAGE) 500 MG tablet Take 500 mg by mouth 2 (two) times daily with a meal.    Historical Provider, MD  vitamin B-12 (CYANOCOBALAMIN) 500 MCG tablet Take 500 mcg by mouth daily.    Historical Provider, MD   BP 109/67 mmHg  Pulse 81  Temp(Src) 97.9 F (36.6 C) (Oral)  Resp 20  Ht 5\' 7"  (1.702 m)  Wt 185 lb (83.915 kg)  BMI 28.97 kg/m2  SpO2 100%  LMP  Physical Exam  Constitutional: She  appears well-developed and well-nourished. No distress.  HENT:  Head: Normocephalic.  TMs normal bilaterally. Uvula is midline. Oropharynx slightly erythematous, normal tonsils, no exudate.  Eyes: Conjunctivae are normal.  Neck: Neck supple.  Cardiovascular: Normal rate, regular rhythm and normal heart sounds.   Pulmonary/Chest: Effort normal and breath sounds normal. No respiratory distress. She has no wheezes. She has no rales.  Abdominal: Soft. Bowel sounds are normal. She exhibits no distension. There is no tenderness. There is no rebound.  Musculoskeletal: She exhibits no edema.  Lymphadenopathy:    She has no cervical adenopathy.  Neurological: She is alert.  Skin: Skin is warm and dry.  Psychiatric: She has a normal mood and affect. Her behavior is normal.  Nursing note and vitals reviewed.   ED Course  Procedures (including critical care time) Labs Review Labs Reviewed  RAPID STREP SCREEN  CULTURE, GROUP A STREP    Imaging Review No results found.   EKG Interpretation None      MDM   Final diagnoses:  Flu-like symptoms   Pt with flu like symptoms. She is afebrile here, but took tylenol prior to coming in. Oropharynx erythematous, uvula midline, no evidence of peritonsillar abscess. Strep obtained by triage nurse and is negative. I discussed with her that her symptoms could be related to the flu, however also could be just a common cold. I will give a prescription for Tamiflu, however I told her not to take it until her symptoms get worse and she has a documented fever, congestion, cough. Continue Tylenol. Continue to stay hydrated. Follow up with her doctor.  Filed Vitals:   05/21/14 1930  BP: 109/67  Pulse: 81  Temp: 97.9 F (36.6 C)  TempSrc: Oral  Resp: 20  Height: 5\' 7"  (1.702 m)  Weight: 185 lb (83.915 kg)  SpO2: 100%      Jaynie Crumbleatyana Destyn Parfitt, PA-C 05/21/14 2324  Geoffery Lyonsouglas Delo, MD 05/22/14 [phone]

## 2014-05-24 LAB — CULTURE, GROUP A STREP: STREP A CULTURE: NEGATIVE

## 2014-06-16 LAB — OB RESULTS CONSOLE RUBELLA ANTIBODY, IGM: Rubella: IMMUNE

## 2014-06-16 LAB — OB RESULTS CONSOLE RPR: RPR: NONREACTIVE

## 2014-06-16 LAB — OB RESULTS CONSOLE HIV ANTIBODY (ROUTINE TESTING): HIV: NONREACTIVE

## 2014-06-16 LAB — OB RESULTS CONSOLE GC/CHLAMYDIA
Chlamydia: NEGATIVE
GC PROBE AMP, GENITAL: NEGATIVE

## 2014-06-16 LAB — OB RESULTS CONSOLE HEPATITIS B SURFACE ANTIGEN: Hepatitis B Surface Ag: NEGATIVE

## 2014-06-29 ENCOUNTER — Other Ambulatory Visit: Payer: Self-pay | Admitting: Obstetrics and Gynecology

## 2014-06-30 LAB — CYTOLOGY - PAP

## 2014-08-08 ENCOUNTER — Inpatient Hospital Stay (HOSPITAL_COMMUNITY)
Admission: AD | Admit: 2014-08-08 | Discharge: 2014-08-16 | DRG: 779 | Disposition: A | Discharge status: Home / Self Care | Payer: 59 | Source: Ambulatory Visit | Attending: Obstetrics and Gynecology | Admitting: Obstetrics and Gynecology

## 2014-08-08 ENCOUNTER — Encounter (HOSPITAL_COMMUNITY): Payer: Self-pay | Admitting: *Deleted

## 2014-08-08 DIAGNOSIS — O321XX2 Maternal care for breech presentation, fetus 2: Secondary | ICD-10-CM | POA: Diagnosis present

## 2014-08-08 DIAGNOSIS — O42919 Preterm premature rupture of membranes, unspecified as to length of time between rupture and onset of labor, unspecified trimester: Secondary | ICD-10-CM | POA: Diagnosis present

## 2014-08-08 DIAGNOSIS — O9989 Other specified diseases and conditions complicating pregnancy, childbirth and the puerperium: Secondary | ICD-10-CM | POA: Diagnosis not present

## 2014-08-08 DIAGNOSIS — O321XX1 Maternal care for breech presentation, fetus 1: Secondary | ICD-10-CM | POA: Diagnosis present

## 2014-08-08 DIAGNOSIS — O3090 Multiple gestation, unspecified, unspecified trimester: Secondary | ICD-10-CM | POA: Diagnosis present

## 2014-08-08 DIAGNOSIS — O30042 Twin pregnancy, dichorionic/diamniotic, second trimester: Secondary | ICD-10-CM | POA: Diagnosis present

## 2014-08-08 DIAGNOSIS — O021 Missed abortion: Principal | ICD-10-CM | POA: Diagnosis not present

## 2014-08-08 DIAGNOSIS — O42911 Preterm premature rupture of membranes, unspecified as to length of time between rupture and onset of labor, first trimester: Secondary | ICD-10-CM | POA: Diagnosis present

## 2014-08-08 DIAGNOSIS — O3112X1 Continuing pregnancy after spontaneous abortion of one fetus or more, second trimester, fetus 1: Secondary | ICD-10-CM | POA: Diagnosis not present

## 2014-08-08 DIAGNOSIS — Z3A16 16 weeks gestation of pregnancy: Secondary | ICD-10-CM | POA: Diagnosis present

## 2014-08-08 DIAGNOSIS — O42912 Preterm premature rupture of membranes, unspecified as to length of time between rupture and onset of labor, second trimester: Secondary | ICD-10-CM | POA: Diagnosis not present

## 2014-08-08 DIAGNOSIS — IMO0001 Reserved for inherently not codable concepts without codable children: Secondary | ICD-10-CM

## 2014-08-08 DIAGNOSIS — O4290 Premature rupture of membranes, unspecified as to length of time between rupture and onset of labor, unspecified weeks of gestation: Secondary | ICD-10-CM | POA: Insufficient documentation

## 2014-08-08 NOTE — MAU Note (Signed)
PT  SAYS  SHE IS  IVF  PT   WITH  TWINS    .   SAYS  AT 1030PM-  SHE WENT  TO B-ROOM-  THEN STARTED  GUSHING  FLUID   THEN FELT  PRESSURE  VAG .  NO BLEEDING     DID  NOT  CALL DR.     STILL FEELS  FLUID   COME  OUT  WHEN  SHE STANDS.   LAST SEX-     FEB.

## 2014-08-09 ENCOUNTER — Inpatient Hospital Stay (HOSPITAL_COMMUNITY): Payer: 59

## 2014-08-09 ENCOUNTER — Encounter (HOSPITAL_COMMUNITY): Payer: Self-pay | Admitting: *Deleted

## 2014-08-09 ENCOUNTER — Ambulatory Visit (HOSPITAL_COMMUNITY): Payer: 59

## 2014-08-09 DIAGNOSIS — O321XX1 Maternal care for breech presentation, fetus 1: Secondary | ICD-10-CM | POA: Diagnosis present

## 2014-08-09 DIAGNOSIS — Z3A16 16 weeks gestation of pregnancy: Secondary | ICD-10-CM | POA: Diagnosis present

## 2014-08-09 DIAGNOSIS — O021 Missed abortion: Secondary | ICD-10-CM | POA: Diagnosis not present

## 2014-08-09 DIAGNOSIS — O4290 Premature rupture of membranes, unspecified as to length of time between rupture and onset of labor, unspecified weeks of gestation: Secondary | ICD-10-CM | POA: Insufficient documentation

## 2014-08-09 DIAGNOSIS — O9989 Other specified diseases and conditions complicating pregnancy, childbirth and the puerperium: Secondary | ICD-10-CM | POA: Diagnosis present

## 2014-08-09 DIAGNOSIS — O42919 Preterm premature rupture of membranes, unspecified as to length of time between rupture and onset of labor, unspecified trimester: Secondary | ICD-10-CM | POA: Diagnosis present

## 2014-08-09 DIAGNOSIS — O3090 Multiple gestation, unspecified, unspecified trimester: Secondary | ICD-10-CM | POA: Diagnosis present

## 2014-08-09 DIAGNOSIS — O42911 Preterm premature rupture of membranes, unspecified as to length of time between rupture and onset of labor, first trimester: Secondary | ICD-10-CM | POA: Diagnosis present

## 2014-08-09 DIAGNOSIS — O30042 Twin pregnancy, dichorionic/diamniotic, second trimester: Secondary | ICD-10-CM | POA: Diagnosis present

## 2014-08-09 DIAGNOSIS — O42912 Preterm premature rupture of membranes, unspecified as to length of time between rupture and onset of labor, second trimester: Secondary | ICD-10-CM

## 2014-08-09 DIAGNOSIS — O3112X1 Continuing pregnancy after spontaneous abortion of one fetus or more, second trimester, fetus 1: Secondary | ICD-10-CM | POA: Diagnosis not present

## 2014-08-09 DIAGNOSIS — O321XX2 Maternal care for breech presentation, fetus 2: Secondary | ICD-10-CM | POA: Diagnosis present

## 2014-08-09 LAB — CBC WITH DIFFERENTIAL/PLATELET
BASOS PCT: 0 % (ref 0–1)
Basophils Absolute: 0 10*3/uL (ref 0.0–0.1)
Eosinophils Absolute: 0 10*3/uL (ref 0.0–0.7)
Eosinophils Relative: 1 % (ref 0–5)
HCT: 28.4 % — ABNORMAL LOW (ref 36.0–46.0)
HEMOGLOBIN: 9.6 g/dL — AB (ref 12.0–15.0)
LYMPHS PCT: 24 % (ref 12–46)
Lymphs Abs: 1.5 10*3/uL (ref 0.7–4.0)
MCH: 28.5 pg (ref 26.0–34.0)
MCHC: 33.8 g/dL (ref 30.0–36.0)
MCV: 84.3 fL (ref 78.0–100.0)
Monocytes Absolute: 0.4 10*3/uL (ref 0.1–1.0)
Monocytes Relative: 6 % (ref 3–12)
NEUTROS PCT: 69 % (ref 43–77)
Neutro Abs: 4.4 10*3/uL (ref 1.7–7.7)
PLATELETS: 229 10*3/uL (ref 150–400)
RBC: 3.37 MIL/uL — AB (ref 3.87–5.11)
RDW: 14.1 % (ref 11.5–15.5)
WBC: 6.3 10*3/uL (ref 4.0–10.5)

## 2014-08-09 MED ORDER — SODIUM CHLORIDE 0.9 % IV SOLN
2.0000 g | Freq: Four times a day (QID) | INTRAVENOUS | Status: DC
  Administered 2014-08-09 – 2014-08-15 (×24): 2 g via INTRAVENOUS
  Filled 2014-08-09 (×26): qty 2000

## 2014-08-09 MED ORDER — PRENATAL MULTIVITAMIN CH
1.0000 | ORAL_TABLET | Freq: Every day | ORAL | Status: DC
  Administered 2014-08-10 – 2014-08-16 (×7): 1 via ORAL
  Filled 2014-08-09 (×9): qty 1

## 2014-08-09 MED ORDER — BUTALBITAL-APAP-CAFFEINE 50-325-40 MG PO TABS
1.0000 | ORAL_TABLET | ORAL | Status: DC | PRN
  Administered 2014-08-09: 2 via ORAL
  Filled 2014-08-09: qty 2

## 2014-08-09 MED ORDER — PIPERACILLIN-TAZOBACTAM 3.375 G IVPB
3.3750 g | Freq: Three times a day (TID) | INTRAVENOUS | Status: DC
  Administered 2014-08-09 (×2): 3.375 g via INTRAVENOUS
  Filled 2014-08-09 (×4): qty 50

## 2014-08-09 MED ORDER — DEXTROSE 5 % IV SOLN
500.0000 mg | INTRAVENOUS | Status: DC
  Administered 2014-08-09 – 2014-08-15 (×7): 500 mg via INTRAVENOUS
  Filled 2014-08-09 (×7): qty 500

## 2014-08-09 MED ORDER — ACETAMINOPHEN 325 MG PO TABS
650.0000 mg | ORAL_TABLET | Freq: Four times a day (QID) | ORAL | Status: DC | PRN
  Administered 2014-08-09: 650 mg via ORAL
  Filled 2014-08-09: qty 2

## 2014-08-09 MED ORDER — ZOLPIDEM TARTRATE 5 MG PO TABS
5.0000 mg | ORAL_TABLET | Freq: Every evening | ORAL | Status: DC | PRN
  Administered 2014-08-09 – 2014-08-15 (×7): 5 mg via ORAL
  Filled 2014-08-09 (×7): qty 1

## 2014-08-09 MED ORDER — SODIUM CHLORIDE 0.9 % IV SOLN
INTRAVENOUS | Status: DC
  Administered 2014-08-09 – 2014-08-11 (×7): via INTRAVENOUS

## 2014-08-09 MED ORDER — DOCUSATE SODIUM 100 MG PO CAPS
100.0000 mg | ORAL_CAPSULE | Freq: Two times a day (BID) | ORAL | Status: DC
  Administered 2014-08-09 – 2014-08-16 (×14): 100 mg via ORAL
  Filled 2014-08-09 (×15): qty 1

## 2014-08-09 NOTE — Progress Notes (Signed)
Patient is doing well.  No complaints except for some lower abdominal pressure - mild.  BP 102/53 mmHg  Pulse 86  Temp(Src) 98.7 F (37.1 C) (Oral)  Resp 18  Ht 5\' 7"  (1.702 m)  Wt 93.951 kg (207 lb 2 oz)  BMI 32.43 kg/m2  SpO2 100%  LMP 01/08/2014 (Exact Date) Fetal heart tones x 2 heard at bedside  Abdomen is soft and non tender  IMPRESSION: IUP at 16 w 6 days TWINS PPROM of sac A  PLAN: Day # 1 - 2 of PPROM Continue IV Antibiotics MFM consultation with ultrasound today I had an extensive bedside discussion with patient and her husband today about all potential scenarios and possible poor outcome and poor prognosis. Questions answered Advised her of necessity to proceed with delivery if chorioamnionitis develops

## 2014-08-09 NOTE — Plan of Care (Signed)
Problem: Phase I Progression Outcomes Goal: OOB as tolerated unless otherwise ordered Outcome: Not Applicable Date Met:  54/09/81 Strict bedrest

## 2014-08-09 NOTE — Progress Notes (Signed)
I spent time with pt and her mother.  They are remaining hopeful and are relying on their faith to help them through this difficult period.  Catisha stated that she and husband are "control freaks" and now they do not have any control over the situation.  She stated that he is struggling to cope with the situation.  She reported good family and community support and appreciated the support she is receiving from staff here in the hospital.  We will continue to check in on her as we are able, but please also page as needs arise.  [address] [place] Pager, [phone] 4:01 PM    08/09/14 1500  Clinical Encounter Type  Visited With Patient  Visit Type Spiritual support  Referral From Nurse  Spiritual Encounters  Spiritual Needs Emotional

## 2014-08-09 NOTE — Consult Note (Signed)
Maternal Fetal Medicine Consultation  Requesting Provider(s): Candice Camp, MD  Reason for consultation: Twin gestation at 64 weeks, PROM of twin A  HPI: Young Young is a 39 year old G1P0 currently at 16w 6d with twin gestation, currently admitted due to PROM of Twin A.  Young Young underwent IVF - previously diagnosed with DC/DA twins.  Her pregnancy was complicated by a subchorionic hemorrhage that was managed with bedrest in the first trimester.  She denies any vaginal bleeding since approximately [redacted] weeks gestation.  The patient was seen in MAU last night with complaints of leakage of fluid.  Limited ultrasound showed severe oligohydramnios around Twin A; normal amniotic fluid volume in Twin B.  Sterile speculum subsequently was positive for ferning.  Young Young is now admitted for a course of latency antibiotics and observation.  She is currently without complaints.  She denies vaginal bleeding or abdominal pain.    OB History: OB History    Gravida Para Term Preterm AB TAB SAB Ectopic Multiple Living   1               PMH:  Past Medical History  Diagnosis Date  . Complication of anesthesia   . Anemia     history  . PCOS (polycystic ovarian syndrome)     treatment with metformin  . PONV (postoperative nausea and vomiting)     PSH:  Past Surgical History  Procedure Laterality Date  . Bilateral foot surgery    . Hand surgery      as a child -tendons & ligaments repair  . Wisdom tooth extraction    . Dilation and curettage of uterus      x 2 for polyp removal  . Dilatation & currettage/hysteroscopy with resectocope  02/03/2012    Procedure: DILATATION & CURETTAGE/HYSTEROSCOPY WITH RESECTOCOPE;  Surgeon: Young Andrea, MD;  Location: [facility];  Service: Gynecology;  Laterality: N/A;  no resection done   Meds:  Scheduled Meds: . azithromycin  500 mg Intravenous Q24H  . piperacillin-tazobactam (ZOSYN)  IV  3.375 g Intravenous Q8H  . prenatal multivitamin  1 tablet Oral  Q1200   Continuous Infusions: . sodium chloride 125 mL/hr at 08/09/14 0157   PRN Meds:.  Allergies:No Known Allergies  FH: History reviewed. No pertinent family history.   Soc:  History   Social History  . Marital Status: Married    Spouse Name: N/A  . Number of Children: N/A  . Years of Education: N/A   Occupational History  . Not on file.   Social History Main Topics  . Smoking status: Never Smoker   . Smokeless tobacco: Never Used  . Alcohol Use: No  . Drug Use: No  . Sexual Activity: Yes    Birth Control/ Protection: None   Other Topics Concern  . Not on file   Social History Narrative    Review of Systems: no vaginal bleeding or cramping/contractions, no LOF, no nausea/vomiting. All other systems reviewed and are negative.  PE:   Filed Vitals:   08/09/14 0602  BP: 102/53  Pulse: 86  Temp: 98.7 F (37.1 C)  Resp: 18    GEN: well-appearing female ABD: gravid, NT  Ultrasound:  DC/DA twin gestation with best dates of 16w 6d Limited ultrasound performed- suspected PROM of twin A  Twin A: Breech, posterior placenta Oligohydramnios noted - no measurable fluid pockets appreciated Limited views of the fetal anatomy obtained - stomach/ kidneys and bladder poorly visualized  Twin B:  Breech, anterior placenta Normal amniotic fluid volume  Labs: CBC    Component Value Date/Time   WBC 6.3 08/09/2014 0508   RBC 3.37* 08/09/2014 0508   HGB 9.6* 08/09/2014 0508   HCT 28.4* 08/09/2014 0508   PLT 229 08/09/2014 0508   MCV 84.3 08/09/2014 0508   MCH 28.5 08/09/2014 0508   MCHC 33.8 08/09/2014 0508   RDW 14.1 08/09/2014 0508   LYMPHSABS 1.5 08/09/2014 0508   MONOABS 0.4 08/09/2014 0508   EOSABS 0.0 08/09/2014 0508   BASOSABS 0.0 08/09/2014 0508    A/P: 1) DC/DA twin gestation at 16w 6d  2) PROM of twin A: had long discussion regarding prognosis and management options.  We reviewed risks of labor (average latency ~ 2 weeks with midtrimester PROM),  risk of chorioamnionitis, abruption, pulmonary hypoplasia and stillbirth of twin A.  We also discussed the possibility that if Twin A delivered that there is a possibility that Twin B may or may not deliver immediately thereafter.  We briefly discussed delayed interval delivery - and that while there are reports of some successful outcomes by placing a cerclage after the delivery of Twin A, this intervention is a very aggressive treatment option that is fraught with risks and complications. We also discussed expectant management - and that while there are currently no signs of infection or imminent delivery the likelihood that she will remain pregnant until viability is unlikely.   We briefly discussed medical termination. The couple is aware that the legal limits of termination is 20 weeks in [place].  In my opinion, I feel that the risk of infection/ sepsis and hemorrhage is significant enough to meet the requirements for exemption from the 72 hour rule if the patient and her husband choose to proceed with medical induction of labor.  Recommendations: 1) The patient was started on latency antibiotics yesterday.  My preference would be for IV Ampicillin rather that Zosyn in addition to azithromycin as ordered.  Would continue IV antibiotics for 72 hours and transition to oral Amoxicillin / Azythromycin for a total of 7 day course of antibiotics (Amoxicillin for 4 days; 4 day course of Azythromcycin / Z-pack). 2) If stable after IV antibiotics are complete, would discharge home with close outpatient follow up - at least weekly outpatient visits. Would check oral temps at least every 8-12 hours and weekly CBCs - readmit if there is any suspicion of infection or labor. 3) Assuming that the patient elects continued expectant management, would recommend a NICU consult as she approaches viability - would readmit at approximately [redacted] weeks gestation for course of betamethasone and inpatient management. 4)  In the event that the patient delivers Twin A, would try to do a high ligation of the cord (endo loops work well) and continued observation.  While I do not favor cerclage for delayed interval delivery, this intervention is usually performed within 24 hours of delivery (usually after a 6-8 hour period of observation to ensure that Twin B does not deliver imminently).   Thank you for the opportunity to be a part of the care of MALAVIKA LIRA. Please contact our office if we can be of further assistance.   I spent approximately 30 minutes with this patient with over 50% of time spent in face-to-face counseling.   Alpha Gula, MD Maternal Fetal Medicine

## 2014-08-09 NOTE — MAU Provider Note (Signed)
History     CSN: [id number]  Arrival date and time: 08/08/14 2325   First Provider Initiated Contact with Patient 08/08/14 2356      Chief Complaint  Patient presents with  . Rupture of Membranes   HPI  [name] is a 39 y.o. G1P0 at [redacted]w[redacted]d with twin gestation as the results of IVF who presents to MAU today with complaint of LOF since 2230 today. Patient states Di-Di twins. She states subchorionic hemorrhage noted in first trimester, patient was on bed rest. As of [place] at the end of May hemorrhage had resolved, per patient. She denies LOF prior to tonight. She states mild LLQ abdominal pressure, but denies pain. She also denies vaginal bleeding or fever.   OB History    Gravida Para Term Preterm AB TAB SAB Ectopic Multiple Living   1               Past Medical History  Diagnosis Date  . Complication of anesthesia   . Anemia     history  . PCOS (polycystic ovarian syndrome)     treatment with metformin  . PONV (postoperative nausea and vomiting)     Past Surgical History  Procedure Laterality Date  . Bilateral foot surgery    . Hand surgery      as a child -tendons & ligaments repair  . Wisdom tooth extraction    . Dilation and curettage of uterus      x 2 for polyp removal  . Dilatation & currettage/hysteroscopy with resectocope  02/03/2012    Procedure: DILATATION & CURETTAGE/HYSTEROSCOPY WITH RESECTOCOPE;  Surgeon: Leslie Andrea, MD;  Location: [facility];  Service: Gynecology;  Laterality: N/A;  no resection done    No family history on file.  History  Substance Use Topics  . Smoking status: Never Smoker   . Smokeless tobacco: Never Used  . Alcohol Use: No    Allergies: No Known Allergies  Prescriptions prior to admission  Medication Sig Dispense Refill Last Dose  . Biotin 10 MG TABS Take 1 tablet by mouth daily.   12/12/2013 at Unknown time  . CHROMIUM PO Take 1 tablet by mouth daily.   Unknown at Unknown time  . folic acid (FOLVITE) 400 MCG  tablet Take 400 mcg by mouth daily.   12/12/2013 at Unknown time  . HYDROcodone-ibuprofen (VICOPROFEN) 7.5-200 MG per tablet Take 1 tablet by mouth every 8 (eight) hours as needed for moderate pain. 20 tablet 0 Unknown at Unknown time  . Inositol Niacinate 500 MG TABS Take 1 tablet by mouth daily.   12/12/2013 at Unknown time  . Lysine 1000 MG TABS Take 1 tablet by mouth daily.   12/12/2013 at Unknown time  . metFORMIN (GLUCOPHAGE) 500 MG tablet Take 500 mg by mouth 2 (two) times daily with a meal.   12/12/2013 at Unknown time  . oseltamivir (TAMIFLU) 75 MG capsule Take 1 capsule (75 mg total) by mouth every 12 (twelve) hours. 10 capsule 0   . Prenatal MV-Min-Fe Fum-FA-DHA (PRENATAL 1 PO) Take by mouth.     Marland Kitchen PROGESTERONE IM Inject into the muscle.     . vitamin B-12 (CYANOCOBALAMIN) 500 MCG tablet Take 500 mcg by mouth daily.   12/12/2013 at Unknown time    Review of Systems  Constitutional: Negative for fever and malaise/fatigue.  Gastrointestinal: Negative for nausea, vomiting and abdominal pain.  Genitourinary:       Neg - vaginal bleeding + LOF  Physical Exam   Blood pressure 113/65, pulse 90, temperature 97.9 F (36.6 C), temperature source Oral, resp. rate 18, height 5\' 7"  (1.702 m), weight 207 lb 2 oz (93.951 kg), last menstrual period 01/08/2014.  Physical Exam  Nursing note and vitals reviewed. Constitutional: She is oriented to person, place, and time. She appears well-developed and well-nourished. No distress.  HENT:  Head: Normocephalic and atraumatic.  Cardiovascular: Normal rate.   Respiratory: Effort normal.  GI: Soft. She exhibits no distension and no mass. There is no tenderness. There is no rebound and no guarding.  Genitourinary: No bleeding in the vagina. Vaginal discharge (small amount of clear watery fluid noted from the cervix) found.  Neurological: She is alert and oriented to person, place, and time.  Skin: Skin is warm and dry. No erythema.  Psychiatric:  She has a normal mood and affect.     MAU Course  Procedures None  MDM Crist Fat - positive Discussed patient presentation with Dr. Rana Snare. He agrees with plan for bedside US for further assessment [place] ordered to be performed at bedside to assess AFI and FHR Preliminary [place] report shows:  Baby A - FHR 150 bpm, breech presentation, lower fetus, no measurable fluid Baby B - FHR 142 bpm, breech presentation, upper fetus, subjectively normal fluid Discussed preliminary [place] results with Dr. Rana Snare. He recommends admission for observation overnight and MFM consult in the morning as well as IV antibiotics Discussed antibiotic regimen with PharmD. Zosyn 3.375 mg q 8 hours and Zithromax 500 mg q day ordered.   Assessment and Plan  A: Twin gestation at [redacted]w[redacted]d PPROM of twin A  P: Admit to [facility] Unit for IV antibiotics and MFM consult  Marny Lowenstein, PA-C  08/09/2014, 12:04 AM

## 2014-08-10 MED ORDER — POLYETHYLENE GLYCOL 3350 17 G PO PACK
17.0000 g | PACK | Freq: Every day | ORAL | Status: DC
  Administered 2014-08-10 – 2014-08-16 (×7): 17 g via ORAL
  Filled 2014-08-10 (×8): qty 1

## 2014-08-10 MED ORDER — BISACODYL 10 MG RE SUPP
10.0000 mg | Freq: Once | RECTAL | Status: AC
  Administered 2014-08-10: 10 mg via RECTAL
  Filled 2014-08-10: qty 1

## 2014-08-10 NOTE — Progress Notes (Signed)
Pt called out with c/o passing something from vagina - RN noted nonviable fetus A delivered in bed.  Upon my arrival, mom holding infant and cord clamped - cord clamped close to perineum. No active VB noted.   Patient denies pain

## 2014-08-10 NOTE — Progress Notes (Signed)
Patient is doing well. No complaints except for some lower abdominal pressure - mild.  Fetal heart tones x 2 heard at bedside  Abdomen is soft and non tender  IMPRESSION: IUP at 17 wks TWINS PPROM of sac A  PLAN: Continue IV Antibiotics S/p MFM consult - pt and husband desire exp mngt Questions answered Advised her of necessity to proceed with delivery if chorioamnionitis develops Will rpt [place] in one week to eval if any accumulation of AF

## 2014-08-10 NOTE — Progress Notes (Signed)
Patient transferred to [facility] room 371 via bed

## 2014-08-10 NOTE — Progress Notes (Signed)
Patient stated she passed a clot, after viewing found fetus lying on perineum. Rapid OB RN called, house coverage RN notified rapid response RN called.  Dr. Renaldo Fiddler called.   Dr. Renaldo Fiddler in room at 1926.

## 2014-08-10 NOTE — Progress Notes (Signed)
Pt was transferred from [facility] to AICU following spontaneous vaginal delivery of Twin A (PPROM 6/20).  Non viable female fetus.  Placenta undelivered and membranes intact Twin B.  Minimal vaginal bleeding noted on transfer but pt c/o severe rectal pressure s/t hx ongoing constipation.  Stool noted at rectum but unable to pass.  Manual disimpaction performed with large, hard brown stool removed.  Pt states felt immediate relief, pressure gone and denies pain after stool removal.  No membranes noted, scant vaginal bleeding.  Abd palpated soft and non tender.  Expectant care.

## 2014-08-10 NOTE — Progress Notes (Signed)
Pt reports bleeding when wiping after using the bathroom. Scant amount on pad afterwards. Dr. Renaldo Fiddler notified and will round on patient again this afternoon.

## 2014-08-10 NOTE — Progress Notes (Signed)
Pt c/o still feeling increased pressure SSE done and bulging membranes noted beyond cervix Rec transfer to L&D or AICU

## 2014-08-11 ENCOUNTER — Inpatient Hospital Stay (HOSPITAL_COMMUNITY)
Admit: 2014-08-11 | Discharge: 2014-08-11 | Disposition: A | Discharge status: Home / Self Care | Payer: 59 | Attending: Maternal and Fetal Medicine | Admitting: Maternal and Fetal Medicine

## 2014-08-11 ENCOUNTER — Inpatient Hospital Stay (HOSPITAL_COMMUNITY): Payer: 59

## 2014-08-11 LAB — CBC
HCT: 29.6 % — ABNORMAL LOW (ref 36.0–46.0)
HEMOGLOBIN: 9.9 g/dL — AB (ref 12.0–15.0)
MCH: 28.8 pg (ref 26.0–34.0)
MCHC: 33.4 g/dL (ref 30.0–36.0)
MCV: 86 fL (ref 78.0–100.0)
Platelets: 241 10*3/uL (ref 150–400)
RBC: 3.44 MIL/uL — AB (ref 3.87–5.11)
RDW: 14.6 % (ref 11.5–15.5)
WBC: 8.7 10*3/uL (ref 4.0–10.5)

## 2014-08-11 MED ORDER — FERROUS SULFATE 325 (65 FE) MG PO TABS
325.0000 mg | ORAL_TABLET | Freq: Every day | ORAL | Status: DC
  Administered 2014-08-11 – 2014-08-16 (×6): 325 mg via ORAL
  Filled 2014-08-11 (×7): qty 1

## 2014-08-11 NOTE — Progress Notes (Signed)
Patient ID: Andrea Young, female   DOB: 17-May-1975, 39 y.o.   MRN: [id number] S: NO ACTIVE BLEEDING PAIN OR PRESSURE O: AF VSS     GRAVID UTERUS NONTENDER A: TWINS AT 17.1 DELIVERY OF TWIN A P: MFM CONSULT     CHECK CBC

## 2014-08-11 NOTE — Progress Notes (Signed)
Pt was lying in bed when I arrived; her husband was bedside. I am acquainted with couple from our faith community. They have very strong faith upon which they are relying. 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 Couple wanted to know how to move forward with remains of baby "A". I suggested that they think of what may be meaningful for them and also to call funeral homes to ascertain who could best serve them. I answered questions as appropriate and referred to for additional information. They also wanted prayer after which Mr. Mathiasen was especially tearful. Please page if additional support is needed. Chaplain Elmarie Shiley Holder   08/11/14 1100  Clinical Encounter Type  Visited With Patient and family together

## 2014-08-11 NOTE — Progress Notes (Signed)
MFM note  See my previous consult.  Andrea Young is a 39 yo G1P0 s/p IVF with DC/DA twin gestation at 17w 1d who was admitted earlier this week with PROM of Twin A.  She is currently on latency antibiotics.  Last night at ~ 7:30 PM twin A delivered spontaneously.  [name] currently denies any uterine contractions or vaginal bleeding.  She was seen today for further recommendations regarding her management.  Labs: CBC    Component Value Date/Time   WBC 8.7 08/11/2014 1100   RBC 3.44* 08/11/2014 1100   HGB 9.9* 08/11/2014 1100   HCT 29.6* 08/11/2014 1100   PLT 241 08/11/2014 1100   MCV 86.0 08/11/2014 1100   MCH 28.8 08/11/2014 1100   MCHC 33.4 08/11/2014 1100   RDW 14.6 08/11/2014 1100   LYMPHSABS 1.5 08/09/2014 0508   MONOABS 0.4 08/09/2014 0508   EOSABS 0.0 08/09/2014 0508   BASOSABS 0.0 08/09/2014 0508    Ultrasound: DC/DA twin gestation at 17w 1d PROM Twin A - delivered presenting twin last night Limited ultrasound performed of Twin B The fetus is cephalic  Posterior placenta Normal amniotic fluid volume  On trans labial ultrasound, a normal cervical length is appreciated (3.7 cm).  No funneling is noted.   A/P: 1) DC/DA twin gestation s/p PROM and delivery of Twin A. -  Normal amniotic fluid volume noted around Twin B and a normal cervical length is noted by trans labial ultrasound.  I had a long discussion with the patient and her husband regarding management options.  Given her current gestational age, she remains at high risk for labor, PROM and delivery of Twin B at a previable gestation.  We discussed the option of delayed interval delivery and cerclage.  The medical literature is limited - most of the case series of delayed interval delivery and cerclage report a latency period of approximately 30-40 days which would still result in a previable delivery.  There are also reports of successful delayed interval delivery without cerclage and expectant management without  intervention is certainly an acceptable option.  Most of the reported series in the literature would recommend cerclage within the first 24 hours of delivery, usually following amniocentesis to rule out a subclinical intra-amniotic infection.  Regardless of the intervention, there is a high risk of chorioamnionitis with the umbilical cord stump from Twin A in the vagina.  After our discussion, the patient and her husband decided that they would prefer expectant management rather than cerclage.  If they reconsider, I would be comfortable offering cerclage tomorrow morning - but would not delay the procedure beyond that time frame. If needed, amniocentesis can be performed following the procedure, prior to hospital discharge to rule out a subclinical infection.  If cerclage is not preformed, would continue IV antibiotics over the next 48-72 hours.  If stable, may consider hospital discharge at that time with close outpatient follow up.  Thank you for the opportunity to participate in [name] care.  Please contact me if you have any further questions.  I spent 30 minutes with the  Patient - 50% of which was face-to-face counseling.  Alpha Gula, MD Maternal Fetal Medicine

## 2014-08-11 NOTE — Progress Notes (Signed)
UR chart review completed.  

## 2014-08-12 ENCOUNTER — Encounter (HOSPITAL_COMMUNITY)
Admission: AD | Disposition: A | Discharge status: Home / Self Care | Payer: Self-pay | Source: Ambulatory Visit | Attending: Obstetrics and Gynecology

## 2014-08-12 SURGERY — CERCLAGE, CERVIX, VAGINAL APPROACH
Anesthesia: Choice

## 2014-08-12 NOTE — Progress Notes (Signed)
[redacted]w[redacted]d  S// resting, no change in pressure , spottoing or leaking  O// BP 89/48 mmHg  Pulse 81  Temp(Src) 97.7 F (36.5 C) (Oral)  Resp 18  Ht 5\' 7"  (1.702 m)  Wt 206 lb 1.6 oz (93.486 kg)  BMI 32.27 kg/m2  SpO2 100%  LMP 01/08/2014 (Exact Date)  CBC    Component Value Date/Time   WBC 8.7 08/11/2014 1100   RBC 3.44* 08/11/2014 1100   HGB 9.9* 08/11/2014 1100   HCT 29.6* 08/11/2014 1100   PLT 241 08/11/2014 1100   MCV 86.0 08/11/2014 1100   MCH 28.8 08/11/2014 1100   MCHC 33.4 08/11/2014 1100   RDW 14.6 08/11/2014 1100   LYMPHSABS 1.5 08/09/2014 0508   MONOABS 0.4 08/09/2014 0508   EOSABS 0.0 08/09/2014 0508   BASOSABS 0.0 08/09/2014 0508    A+P// [redacted]w[redacted]d, del twin A, twin B stable on ABX + BR

## 2014-08-12 NOTE — Progress Notes (Signed)
I spent time with Ms Andrea Young.  She reported that she is doing better today than yesterday.  She and her husband are grieving their baby, Andrea Young, and still maintaining hope for their baby, Andrea Young.    We will continue to follow up as time permits, but please also page as needs arise.  [address] Dyanne Carrel, Bcc Pager, [phone] 11:50 AM    08/12/14 1100  Clinical Encounter Type  Visited With Patient  Visit Type Spiritual support  Referral From Chaplain  Stress Factors  Patient Stress Factors Loss

## 2014-08-13 NOTE — Progress Notes (Signed)
106w3d S/  No change in sx  O// BP 98/58 mmHg  Pulse 71  Temp(Src) 98.6 F (37 C) (Oral)  Resp 16  Ht 5\' 7"  (1.702 m)  Wt 204 lb (92.534 kg)  BMI 31.94 kg/m2  SpO2 100%  LMP 01/08/2014 (Exact Date)  CBC    Component Value Date/Time   WBC 8.7 08/11/2014 1100   RBC 3.44* 08/11/2014 1100   HGB 9.9* 08/11/2014 1100   HCT 29.6* 08/11/2014 1100   PLT 241 08/11/2014 1100   MCV 86.0 08/11/2014 1100   MCH 28.8 08/11/2014 1100   MCHC 33.4 08/11/2014 1100   RDW 14.6 08/11/2014 1100   LYMPHSABS 1.5 08/09/2014 0508   MONOABS 0.4 08/09/2014 0508   EOSABS 0.0 08/09/2014 0508   BASOSABS 0.0 08/09/2014 0508    A+P//stable, cont ABX and obsv

## 2014-08-13 NOTE — Progress Notes (Signed)
Spent time with Mr./Mrs. Andrea Young today. She reported that she felt tired, but ok. They seemed better today in their stage of grieving-not tearful. Had prayer with them and completed visit in order for pt to rest. They were very appreciative of visit and prayer.  Pls page if additional assistance is needed. Chaplain Elmarie Shiley Holder   08/13/14 1600  Clinical Encounter Type  Visited With Patient and family together

## 2014-08-14 NOTE — Progress Notes (Signed)
[redacted]w[redacted]d  S/  No new complaints  O// BP 100/59 mmHg  Pulse 84  Temp(Src) 98.3 F (36.8 C) (Oral)  Resp 18  Ht 5\' 7"  (1.702 m)  Wt 204 lb (92.534 kg)  BMI 31.94 kg/m2  SpO2 100%  LMP 01/08/2014 (Exact Date)  FHR twin B stable  A+P// [redacted]w[redacted]d, del twin A, twin B FHR stable, per MFM on Friday>>>   "If cerclage is not preformed, would continue IV antibiotics over the next 48-72 hours. If stable, may consider hospital discharge at that time with close outpatient follow up."

## 2014-08-15 NOTE — Progress Notes (Signed)
No pain or LOF.  Scant spotting.  AF, VSS Gen - NAD Abd - gravid, NT Ext - NT PV - deferred  A/P:  PPROM and SVD of twin A, Twin B stable S/p additional 72 hrs abx after delivery - pt now considering outpt mngt until 23 wks Will d/c Abx this am, consult w/ mfm, consider d/c home based on consult (possible cervix length)

## 2014-08-15 NOTE — Progress Notes (Signed)
Andrea Young was lying in bed when I arrived; her cousin was bedside. She said she felt better today and is waiting for instructions for self-care. She admitted she was a control freak but that this has taught her a lesson about patience and that she has no control over what has happened. 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 I provided presence, support and encouragement for Jalyric as she reviewed what has happened and how her life looks moving forward. She has strong faith and seems to become more grounded in her faith. Chaplain Elmarie Shiley Holder   08/15/14 1300  Clinical Encounter Type  Visited With Patient and family together

## 2014-08-16 ENCOUNTER — Inpatient Hospital Stay (HOSPITAL_COMMUNITY): Payer: 59

## 2014-08-16 LAB — CBC
HCT: 33.4 % — ABNORMAL LOW (ref 36.0–46.0)
Hemoglobin: 11.3 g/dL — ABNORMAL LOW (ref 12.0–15.0)
MCH: 28.9 pg (ref 26.0–34.0)
MCHC: 33.8 g/dL (ref 30.0–36.0)
MCV: 85.4 fL (ref 78.0–100.0)
Platelets: 292 10*3/uL (ref 150–400)
RBC: 3.91 MIL/uL (ref 3.87–5.11)
RDW: 14 % (ref 11.5–15.5)
WBC: 5.3 10*3/uL (ref 4.0–10.5)

## 2014-08-16 MED ORDER — FERROUS SULFATE 325 (65 FE) MG PO TABS: 325.0000 mg | ORAL_TABLET | Freq: Every day | ORAL | Status: DC

## 2014-08-16 NOTE — Progress Notes (Signed)
No pain/bleeding  VSS Afeb  Dr Fredda HammedWhitecar's evaluation noted  A/P: DC/DA twin gestation @ 17 6/7 weeks S/P delivery of A         Check CBC         If OK, D/C home         Reviewed with patient instructions-rest, daily temperature, call for elevated temp/body aches/bleeding/leaking         Weekly office checks and CBC         U/S 1-2 weeks

## 2014-08-16 NOTE — Consult Note (Signed)
MFM Consult  See my previous consult. [name] is a 39 yo G1P0 s/p IVF with DC/DA twin gestation currently at 17w 6d who was admitted last week with PROM of Twin A.Twin A delivered spontaneously at about 7:00 PM on 22 June.  After counseling, the patient and her husband decided to continue expectant management rather that delayed interval delivery cerclage.  She completed a 72 hour course of IV antibiotics following the delivery.  She is currently asymptomatic - having some brownish vaginal discharge but denies active vaginal bleeding, leakage of fluid or cramping.  Filed Vitals:   08/16/14 1152  BP: 112/69  Pulse: 81  Temp: 98.2 F (36.8 C)  Resp: 16   Abd: soft, NT, gravid  Ultrasound: DC/DA twin gestation at 17w 6d s/p PROM and spontaneous delivery ot Twin A  Limited ultrasound performed The remaining twin is breech presentation Normal amniotic fluid volume  TVUS - cervical length 3.7 cm; a hypoechoic area is noted in the cervical canal that is most likely a segment of cord from Twin A; unlikely to be cervical funneling Twin A's  placenta is posterior; the leading edge of the placenta appears to abut the internal os (low lying) - would not anticipate bleeding issues as the placenta is now devascularized and was able to deliver Twin A without bleeding issues.  A/P: 1) DC/DA twin gestation at 17w 6d, PROM and subsequent delivery of Twin A - the patient is currently stable without s/sx of intra-amniotic infection or imminent delivery.  Both she and her husband understand that she is at high risk for infection or spontaneous delivery of Twin B  Recommendations: 1)Would check CBC now - if WBC count is normal, would be comfortable discharging home with close outpatient management. 2) Would have the patient check her temperature at home 3-4x daily - should return to the clinic or hospital for temp > 100, cramping, active vaginal bleeding or leakage of fluid 3) Recommend weekly  outpatient clinic visits - I would check weekly CBCs to look for leukocytosis as a possible screen for intra-amniotic infection 4) Would admit at ~23 weeks for course of betamethasone, inpatient management and NICU consult 5) Ultrasound for anatomy in 1-2 weeks; please contact our office if you would prefer that she be seen with MFM.   Thank you for the opportunity to be a part of the care of Andrea Young. Please contact our office if we can be of further assistance.   I spent approximately 20 minutes with this patient with over 50% of time spent in face-to-face counseling.  Alpha GulaPaul Nayely Dingus, MD Maternal Fetal Medicine

## 2014-08-16 NOTE — Discharge Summary (Signed)
Physician Discharge Summary  Patient ID: Andrea Young MRN: [id number] DOB/AGE: 08/23/1975 39 y.o.  Admit date: 08/08/2014 Discharge date: 08/16/2014  Admission Diagnoses:twin gestation, PPROM  Discharge Diagnoses:  Active Problems:   Multiple pregnancy   ROM (rupture of membranes), premature   Preterm premature rupture of membranes (PPROM) with unknown onset of labor Delivery of twin A  Discharged Condition: stable  Hospital Course: admitted for PPROM of twin A. Spontaneously delivered twin A about 7 pm on 08/10/14. Cord clamped and cut short. MFM consultation obtained then and prior to D/C. She completed a 72 hour course of IV antibiotics after delivery. Has remained stable without BRB, leaking or cramping. CBC OK today.  Consults: maternal fetal medicine  Significant Diagnostic Studies: labs:  Results for orders placed or performed during the hospital encounter of 08/08/14 (from the past 72 hour(s))  CBC     Status: Abnormal   Collection Time: 08/16/14  2:38 PM  Result Value Ref Range   WBC 5.3 4.0 - 10.5 K/uL   RBC 3.91 3.87 - 5.11 MIL/uL   Hemoglobin 11.3 (L) 12.0 - 15.0 g/dL   HCT 01.033.4 (L) 27.236.0 - 53.646.0 %   MCV 85.4 78.0 - 100.0 fL   MCH 28.9 26.0 - 34.0 pg   MCHC 33.8 30.0 - 36.0 g/dL   RDW 64.414.0 03.411.5 - 74.215.5 %   Platelets 292 150 - 400 K/uL    Treatments: antibiotics: azithromycin and ampicillin  Discharge Exam: Blood pressure 112/69, pulse 81, temperature 98.2 F (36.8 C), temperature source Oral, resp. rate 16, height 5\' 7"  (1.702 m), weight 204 lb (92.534 kg), last menstrual period 01/08/2014, SpO2 100 %. General appearance: alert, cooperative and no distress GI: soft, non-tender; bowel sounds normal; no masses,  no organomegaly  Disposition: 01-Home or Self Care     Medication List    STOP taking these medications        folic acid 400 MCG tablet  Commonly known as:  FOLVITE     vitamin B-12 500 MCG tablet  Commonly known as:  CYANOCOBALAMIN       TAKE these medications        ferrous sulfate 325 (65 FE) MG tablet  Take 1 tablet (325 mg total) by mouth daily with breakfast.     PRENATAL 1 PO  Take by mouth.         Signed: Retta MacMBLIN II,Tamani Durney E 08/16/2014, 5:30 PM

## 2014-08-16 NOTE — Discharge Instructions (Signed)
No vaginal entry Rest No heavy lifting Take temperature 3-4 x/day and call if greater than 100.4 F

## 2014-08-16 NOTE — Progress Notes (Signed)
Patient discharged home with husband... Discharge instructions reviewed with patient and she verbalized understanding... Condition stable... No equipment... Taken to car via wheelchair by P. Beck, NT.  

## 2014-08-17 ENCOUNTER — Other Ambulatory Visit (HOSPITAL_COMMUNITY): Payer: Self-pay | Admitting: Obstetrics and Gynecology

## 2014-08-17 DIAGNOSIS — O30042 Twin pregnancy, dichorionic/diamniotic, second trimester: Secondary | ICD-10-CM

## 2014-08-25 ENCOUNTER — Other Ambulatory Visit (HOSPITAL_COMMUNITY): Payer: Self-pay | Admitting: Obstetrics and Gynecology

## 2014-08-25 ENCOUNTER — Encounter (HOSPITAL_COMMUNITY): Payer: Self-pay

## 2014-08-25 ENCOUNTER — Ambulatory Visit (HOSPITAL_COMMUNITY)
Admission: RE | Admit: 2014-08-25 | Discharge: 2014-08-25 | Disposition: A | Discharge status: Home / Self Care | Payer: 59 | Source: Ambulatory Visit | Attending: Family Medicine | Admitting: Family Medicine

## 2014-08-25 ENCOUNTER — Other Ambulatory Visit (HOSPITAL_COMMUNITY): Payer: Self-pay | Admitting: Maternal and Fetal Medicine

## 2014-08-25 VITALS — BP 112/69 | HR 72 | Wt 209.4 lb

## 2014-08-25 DIAGNOSIS — O30042 Twin pregnancy, dichorionic/diamniotic, second trimester: Secondary | ICD-10-CM | POA: Insufficient documentation

## 2014-08-25 DIAGNOSIS — Z3A19 19 weeks gestation of pregnancy: Secondary | ICD-10-CM | POA: Insufficient documentation

## 2014-08-25 DIAGNOSIS — O09812 Supervision of pregnancy resulting from assisted reproductive technology, second trimester: Secondary | ICD-10-CM | POA: Diagnosis not present

## 2014-08-25 DIAGNOSIS — O42919 Preterm premature rupture of membranes, unspecified as to length of time between rupture and onset of labor, unspecified trimester: Secondary | ICD-10-CM

## 2014-09-16 ENCOUNTER — Inpatient Hospital Stay (HOSPITAL_COMMUNITY)
Admission: AD | Admit: 2014-09-16 | Discharge: 2014-09-30 | DRG: 765 | Disposition: A | Discharge status: Home / Self Care | Payer: 59 | Source: Ambulatory Visit | Attending: Obstetrics and Gynecology | Admitting: Obstetrics and Gynecology

## 2014-09-16 ENCOUNTER — Ambulatory Visit (HOSPITAL_COMMUNITY)
Admission: RE | Admit: 2014-09-16 | Discharge: 2014-09-16 | Disposition: A | Discharge status: Home / Self Care | Payer: 59 | Source: Ambulatory Visit | Attending: Obstetrics and Gynecology | Admitting: Obstetrics and Gynecology

## 2014-09-16 ENCOUNTER — Encounter (HOSPITAL_COMMUNITY): Payer: Self-pay | Admitting: *Deleted

## 2014-09-16 ENCOUNTER — Encounter (HOSPITAL_COMMUNITY): Payer: Self-pay

## 2014-09-16 DIAGNOSIS — O4590 Premature separation of placenta, unspecified, unspecified trimester: Secondary | ICD-10-CM | POA: Diagnosis present

## 2014-09-16 DIAGNOSIS — E669 Obesity, unspecified: Secondary | ICD-10-CM | POA: Diagnosis present

## 2014-09-16 DIAGNOSIS — O321XX0 Maternal care for breech presentation, not applicable or unspecified: Secondary | ICD-10-CM | POA: Diagnosis present

## 2014-09-16 DIAGNOSIS — Z6830 Body mass index (BMI) 30.0-30.9, adult: Secondary | ICD-10-CM | POA: Diagnosis not present

## 2014-09-16 DIAGNOSIS — O30042 Twin pregnancy, dichorionic/diamniotic, second trimester: Secondary | ICD-10-CM | POA: Diagnosis present

## 2014-09-16 DIAGNOSIS — O99214 Obesity complicating childbirth: Secondary | ICD-10-CM | POA: Diagnosis present

## 2014-09-16 DIAGNOSIS — O321XX1 Maternal care for breech presentation, fetus 1: Secondary | ICD-10-CM | POA: Diagnosis present

## 2014-09-16 DIAGNOSIS — O3432 Maternal care for cervical incompetence, second trimester: Secondary | ICD-10-CM | POA: Diagnosis present

## 2014-09-16 DIAGNOSIS — Z98891 History of uterine scar from previous surgery: Secondary | ICD-10-CM

## 2014-09-16 DIAGNOSIS — Z3A23 23 weeks gestation of pregnancy: Secondary | ICD-10-CM | POA: Diagnosis present

## 2014-09-16 DIAGNOSIS — O09519 Supervision of elderly primigravida, unspecified trimester: Secondary | ICD-10-CM | POA: Diagnosis not present

## 2014-09-16 DIAGNOSIS — R000 Tachycardia, unspecified: Secondary | ICD-10-CM | POA: Diagnosis present

## 2014-09-16 DIAGNOSIS — Z3A22 22 weeks gestation of pregnancy: Secondary | ICD-10-CM | POA: Insufficient documentation

## 2014-09-16 DIAGNOSIS — Z36 Encounter for antenatal screening of mother: Secondary | ICD-10-CM | POA: Insufficient documentation

## 2014-09-16 DIAGNOSIS — O26892 Other specified pregnancy related conditions, second trimester: Secondary | ICD-10-CM | POA: Diagnosis present

## 2014-09-16 LAB — TYPE AND SCREEN
ABO/RH(D): O POS
ANTIBODY SCREEN: NEGATIVE

## 2014-09-16 LAB — CBC
HCT: 32.4 % — ABNORMAL LOW (ref 36.0–46.0)
Hemoglobin: 10.6 g/dL — ABNORMAL LOW (ref 12.0–15.0)
MCH: 28.6 pg (ref 26.0–34.0)
MCHC: 32.7 g/dL (ref 30.0–36.0)
MCV: 87.3 fL (ref 78.0–100.0)
PLATELETS: 238 10*3/uL (ref 150–400)
RBC: 3.71 MIL/uL — AB (ref 3.87–5.11)
RDW: 14.3 % (ref 11.5–15.5)
WBC: 6.1 10*3/uL (ref 4.0–10.5)

## 2014-09-16 MED ORDER — SODIUM CHLORIDE 0.9 % IV SOLN
2.0000 g | Freq: Four times a day (QID) | INTRAVENOUS | Status: AC
  Administered 2014-09-16 – 2014-09-18 (×8): 2 g via INTRAVENOUS
  Filled 2014-09-16 (×9): qty 2000

## 2014-09-16 MED ORDER — LACTATED RINGERS IV SOLN
INTRAVENOUS | Status: DC
  Administered 2014-09-16 – 2014-09-28 (×11): via INTRAVENOUS

## 2014-09-16 MED ORDER — CALCIUM CARBONATE ANTACID 500 MG PO CHEW: 2.0000 | CHEWABLE_TABLET | ORAL | Status: DC | PRN

## 2014-09-16 MED ORDER — PRENATAL MULTIVITAMIN CH
1.0000 | ORAL_TABLET | Freq: Every day | ORAL | Status: DC
  Administered 2014-09-17 – 2014-09-25 (×9): 1 via ORAL
  Filled 2014-09-16 (×9): qty 1

## 2014-09-16 MED ORDER — AZITHROMYCIN 250 MG PO TABS
500.0000 mg | ORAL_TABLET | Freq: Every day | ORAL | Status: AC
  Administered 2014-09-18 – 2014-09-22 (×5): 500 mg via ORAL
  Filled 2014-09-16 (×6): qty 2

## 2014-09-16 MED ORDER — ACETAMINOPHEN 325 MG PO TABS
650.0000 mg | ORAL_TABLET | ORAL | Status: DC | PRN
  Administered 2014-09-16 – 2014-09-27 (×5): 650 mg via ORAL
  Filled 2014-09-16 (×7): qty 2

## 2014-09-16 MED ORDER — AMOXICILLIN 500 MG PO CAPS
500.0000 mg | ORAL_CAPSULE | Freq: Three times a day (TID) | ORAL | Status: AC
  Administered 2014-09-18 – 2014-09-23 (×15): 500 mg via ORAL
  Filled 2014-09-16 (×15): qty 1

## 2014-09-16 MED ORDER — DOCUSATE SODIUM 100 MG PO CAPS: 100.0000 mg | ORAL_CAPSULE | Freq: Every day | ORAL | Status: DC

## 2014-09-16 MED ORDER — ZOLPIDEM TARTRATE 5 MG PO TABS
5.0000 mg | ORAL_TABLET | Freq: Every evening | ORAL | Status: DC | PRN
  Administered 2014-09-16 – 2014-09-25 (×10): 5 mg via ORAL
  Filled 2014-09-16 (×10): qty 1

## 2014-09-16 MED ORDER — DEXTROSE 5 % IV SOLN
500.0000 mg | INTRAVENOUS | Status: AC
  Administered 2014-09-16 – 2014-09-17 (×2): 500 mg via INTRAVENOUS
  Filled 2014-09-16 (×2): qty 500

## 2014-09-16 NOTE — H&P (Signed)
Young Young is an 39 y.o. female with IVF Di/Di pregnancy. Spontaneous loss of baby A on 08/10/14. Baby B managed expectantly. Presents for scheduled U/S with MFM today. Baby in footling breech 526 gm and cervix noted to be dilated. Speculum exam by Young Young visualized 3 cm dilation of cervix with membranes visible at external os. Patient denies leaking/bleeding. No contractions.   Pertinent Gynecological History: Menses: N/A Bleeding: N/A Contraception: pregnant DES exposure: denies Blood transfusions: none Sexually transmitted diseases: no past history  Previous GYN Procedures: none  Last mammogram: none Date: N/A Last pap: normal Date: 2016 OB History: G1, P0   Menstrual History: Menarche age: unknown  Patient's last menstrual period was 01/08/2014 (exact date).    Past Medical History  Diagnosis Date  . Complication of anesthesia   . Anemia     history  . PCOS (polycystic ovarian syndrome)     treatment with metformin  . PONV (postoperative nausea and vomiting)     Past Surgical History  Procedure Laterality Date  . Bilateral foot surgery    . Hand surgery      as a child -tendons & ligaments repair  . Wisdom tooth extraction    . Dilation and curettage of uterus      x 2 for polyp removal  . Dilatation & currettage/hysteroscopy with resectocope  02/03/2012    Procedure: DILATATION & CURETTAGE/HYSTEROSCOPY WITH RESECTOCOPE;  Surgeon: Young Andrea, MD;  Location: [facility];  Service: Gynecology;  Laterality: N/A;  no resection done    No family history on file.  Social History:  reports that she has never smoked. She has never used smokeless tobacco. She reports that she does not drink alcohol or use illicit drugs.  Allergies: No Known Allergies  Prescriptions prior to admission  Medication Sig Dispense Refill Last Dose  . ferrous sulfate 325 (65 FE) MG tablet Take 1 tablet (325 mg total) by mouth daily with breakfast. 30 tablet 3 Taking  . Prenatal  MV-Min-Fe Fum-FA-DHA (PRENATAL 1 PO) Take by mouth.   Taking    Review of Systems  Constitutional: Negative for fever.  Gastrointestinal: Negative for abdominal pain.    Last menstrual period 01/08/2014. Physical Exam  Cardiovascular: Normal rate.   Respiratory: Effort normal.  GI: Soft. There is no tenderness.  Neurological: She has normal reflexes.    No results found for this or any previous visit (from the past 24 hour(s)).  US Ob Follow Up  09/16/2014   OBSTETRICAL ULTRASOUND: This exam was performed within a [facility]. The OB US report was generated in the [facility] system, and faxed to the ordering physician.   This report is available in the [employer]. See the [facility] Obstetric US report via the Image Link.   Assessment/Plan: 39 yo G1P0 with Di/Di twin pregnancy S/P delivery of Twin A Now 22 2/7 weeks with dilated cervix and visible membranes Footling breech D/W patient and husband above-previable, breech Will ask for neonatology consult to discuss viability, etc If labors before 23 weeks, will allow previable vaginal delivery Will give BMTZ at 23 weeks and at that time consider cesarean section for delivery MBR, Atbs per Young Young  [facility],Young Young E 09/16/2014, 5:07 PM

## 2014-09-16 NOTE — Plan of Care (Signed)
Problem: Consults Goal: Birthing Suites Patient Information Press F2 to bring up selections list   Pt < [redacted] weeks EGA     

## 2014-09-16 NOTE — Consult Note (Signed)
Neonatology Consult Note:  At the request of the patients obstetrician Dr. Gaetano Net I met with Cammie Sickle who is currently 22 2 weeks currently with pregnancy complicated by IVF Di/Di pregnancy with spontaneous loss of baby A on 08/10/14. She presented for a scheduled U/S with MFM today. Baby was in footling breech position with estimated weight of 526 gm.  On exam her cervix was noted to be dilated to 3 cm with membranes visible at external os. She was therefore admitted for observation / expectant management.  No leaking of fluid or contractions.   Discussed with Dr. Gaetano Net with plan to offer resuscitation at 23 weeks and to give betamethasone at 22 5 weeks should delivery be anticipated at 23 weeks.   We discussed morbidity/mortality at this gestional age (about 75% mortality at [redacted] weeks gestation per national average), delivery room resuscitation, including intubation and surfactant in DR.  Discussed mechanical ventilation and risk for chronic lung disease, risk for IVH with potential for motor / cognitive deficits, NEC, sepsis as well as feeding immaturity.  Discussed NG / OG feeds, benefits of MBM in reducing incidence of NEC.    Mrs. Samples articulated that she would like all resuscitative measures to be taken.    Discussed likely length of stay. Thank you for allowing [place] to participate in her care.  Please call with questions.  Higinio Roger, DO  Neonatologist  The total length of face-to-face or floor / unit time for this encounter was 25 minutes.  Counseling and / or coordination of care was greater than fifty percent of the time.

## 2014-09-17 NOTE — Progress Notes (Signed)
Andrea Young was lying, wrapped in bed when I arrived. She said everything was ok today and told me how she was immediately sent from dr's office to hospital ystrdy. She asked me to pray for her and was thankful for visit. Pls page if needed for additional support. Chaplain Elmarie Shiley Holder    09/17/14 1200  Clinical Encounter Type  Visited With Patient

## 2014-09-17 NOTE — Progress Notes (Signed)
22 3/7wks No leaking or bleeding  VSS Afeb Uterus soft, NT  A/P: Twin IVF pregnancy S/P loss of Baby A          Dilated cervix           Breech          Reviewed plan for expectant management until 23 weeks, then cesarean section for delivery          BMTZ at 23 weeks          If some changes @ 22 5/7 weeks would consider BMTZ then in anticipation of possible 23 week delivery

## 2014-09-18 MED ORDER — INDOMETHACIN 50 MG PO CAPS
50.0000 mg | ORAL_CAPSULE | Freq: Once | ORAL | Status: AC
  Administered 2014-09-18: 50 mg via ORAL
  Filled 2014-09-18: qty 1

## 2014-09-18 MED ORDER — LACTATED RINGERS IV BOLUS (SEPSIS)
250.0000 mL | Freq: Once | INTRAVENOUS | Status: AC
  Administered 2014-09-18: 250 mL via INTRAVENOUS

## 2014-09-18 MED ORDER — INDOMETHACIN 25 MG PO CAPS
25.0000 mg | ORAL_CAPSULE | Freq: Four times a day (QID) | ORAL | Status: AC
  Administered 2014-09-18 – 2014-09-20 (×7): 25 mg via ORAL
  Filled 2014-09-18 (×7): qty 1

## 2014-09-18 NOTE — Progress Notes (Signed)
22 4/7 weeks  No leaking/bleeding  VSS Afeb  Uterus soft, NT  A/P: Twin IVF pregnancy S/P loss of Baby A  Dilated cervix   Breech  Reviewed plan for expectant management until 23 weeks, then cesarean section for delivery  BMTZ at 23 weeks  If some changes @ 22 5/7 weeks would consider BMTZ then in anticipation of possible 23 week delivery

## 2014-09-18 NOTE — Progress Notes (Signed)
Andrea Young's husband was bedside when I arrived. He said they were reading Scriptures and immediately grabbed my hand for prayer. He said it was a trying day today. Andrea Young was tearful; husband very supportive. Had prayer bedside and offered comforting care. Please page if any changes or if additional support is needed. Chaplain Elmarie Shiley Holder   09/18/14 1700  Clinical Encounter Type  Visited With Patient and family together

## 2014-09-19 MED ORDER — DOCUSATE SODIUM 100 MG PO CAPS
100.0000 mg | ORAL_CAPSULE | Freq: Two times a day (BID) | ORAL | Status: DC
  Administered 2014-09-19 – 2014-09-25 (×13): 100 mg via ORAL
  Filled 2014-09-19 (×14): qty 1

## 2014-09-19 NOTE — Progress Notes (Signed)
Chaplain provided follow-up/referral spiritual care visit. The patient was alert and awake at time of the visit and was appreciative of the visit. She reports that she is feeling emotionally and physically better on today, and voiced no other concerns at this time. There was no family present at the time of this pastoral visit, but follow-up will continue as needed. Chaplain Janell Quiet [phone]

## 2014-09-19 NOTE — Progress Notes (Signed)
Patient ID: Andrea Young, female   DOB: 02/03/1976, 39 y.o.   MRN: [id number] S: NO CTX'S LEAKING OR BLEEDING O: AFF VSS      GRAVID UTERUS NONTENDER      POST FHT A:  IUP AT 22.5 WEEKS WITH PRIOR LOST OF TWIN A      BREECH      CERVICAL DILATION P:  BEDREST

## 2014-09-20 MED ORDER — INDOMETHACIN 25 MG PO CAPS
25.0000 mg | ORAL_CAPSULE | Freq: Four times a day (QID) | ORAL | Status: AC
  Administered 2014-09-20 – 2014-09-21 (×4): 25 mg via ORAL
  Filled 2014-09-20 (×4): qty 1

## 2014-09-20 MED ORDER — BETAMETHASONE SOD PHOS & ACET 6 (3-3) MG/ML IJ SUSP
12.0000 mg | INTRAMUSCULAR | Status: AC
  Administered 2014-09-20 – 2014-09-21 (×2): 12 mg via INTRAMUSCULAR
  Filled 2014-09-20 (×2): qty 2

## 2014-09-20 NOTE — Progress Notes (Signed)
Patient is doing well. Reports good fetal movement.  Denies contractions or vaginal bleeding.  BP 113/67 mmHg  Pulse 70  Temp(Src) 98.2 F (36.8 C) (Oral)  Resp 20  Ht  (1.702 m)  Wt 97.07 kg (214 lb)  BMI 33.51 kg/m2  LMP 01/08/2014 (Exact Date) Abdomen is soft and non tender  IMPRESSION: IUP at 22 w 6 days Cervical dilation Previous twin IUP and delivery of twin A Breech  PLAN: Continue in patient management  If labor occurs deliver by C Section after 23 weeks Administer steroids first dose today Continue indocin this week bedrest

## 2014-09-21 NOTE — Progress Notes (Signed)
Pt without complaints  AF, VSS + FHT by doppler qshift Abd - gravid, NT  A/P:  Twin IVF pregnancy s/p SVD of baby A - now 23w Continue bedrest C-section for delivery - breech 2nd dose BMZ today Amoxicillin/azithro - day 6 Indocin through steroid time

## 2014-09-22 LAB — TYPE AND SCREEN
ABO/RH(D): O POS
ABO/RH(D): O POS
Antibody Screen: NEGATIVE
Antibody Screen: NEGATIVE

## 2014-09-22 NOTE — Progress Notes (Signed)
No current c/o.  No LOF.  +FM.  VSS. AF. Abd: soft, NT Ext: no c/c/e +FHT  39yo G1 at [redacted]w[redacted]d IVF di/di twins with pre-viable delivery of Twin A (6/22)  Advanced cervical dilation  D7 of latency abx  BMZ mature today  MBR  Will d/c indocin today  Footling breech last u/s (7/29).  C/S for delivery.  S/p NICU consult  NST q shift  Mitchel Honour, DO

## 2014-09-22 NOTE — Progress Notes (Signed)
EFM tracing appropriate for gestational age    

## 2014-09-23 NOTE — Progress Notes (Signed)
Patient ID: Andrea Young, female   DOB: [birth year], 39 y.o.   MRN: [id number] S: NO CTX LEAKAGE OR BLEEDING O: AF VSS      GRAVID UTERUS NONTENDER       FHR CAT ONE FOR 23 WEEKS A: IUP AT 23.2 PRETERM DELIVERY OF TWIN A P: CONTINUE BEDREST REPEAT SONO NEXT WEEK

## 2014-09-23 NOTE — Progress Notes (Signed)
Initial Nutrition Assessment  DOCUMENTATION CODES:   Obesity unspecified  INTERVENTION:  Regular diet, snacks TID as ordered by pt   NUTRITION DIAGNOSIS:   Increased nutrient needs related to  (pregnancy and fetal growth requirements) as evidenced by  ([redacted] weeks pregnant).   GOAL:   Patient will meet greater than or equal to 90% of their needs  MONITOR:   Weight trends  REASON FOR ASSESSMENT:   Antenatal    ASSESSMENT:      23 2/7 weeks with dilated cervix. Pre- pregnancy weight 193 Lbs, BMI 30.3 . Weight gain22 lbs. Weight gain goal 11-20 lbs. Pt is tol regular diet well, meals TID are adequate to meet hunger needs. Provided snack menu in case this changes.  Diet Order:  Diet regular Room service appropriate?: Yes; Fluid consistency:: Thin  Height:   Ht Readings from Last 1 Encounters:  09/16/14  (1.702 m)    Weight:   Wt Readings from Last 1 Encounters:  09/21/14 215 lb 11.2 oz (97.841 kg)    Ideal Body Weight:  61.3 kg  BMI:  Body mass index is 33.78 kg/(m^2).  Estimated Nutritional Needs:   Kcal:  2100-2200  Protein:  90-100 g  Fluid:  2.3 L  EDUCATION NEEDS:   No education needs identified at this time  [name].Odis Luster LDN Neonatal Nutrition Support Specialist/RD III Pager [phone]      Phone [phone]

## 2014-09-24 NOTE — Progress Notes (Signed)
Patient ID: Andrea Young, female   DOB: 11-14-1975, 39 y.o.   MRN: [id number] S: SOME DISCHARGE NO LEAKAGE OR BLEEDING O: AF VSS      GRAVID UTERUS NONTENDER      TRACING LAST PM OK FOR 23 WEEKS NO CTX'S A: IUP AT 23.3 LOST OF TWIN A CERVICAL DILATION P: EXP MANAGEMENT

## 2014-09-25 MED ORDER — TERBUTALINE SULFATE 1 MG/ML IJ SOLN
0.2500 mg | Freq: Once | INTRAMUSCULAR | Status: AC
  Administered 2014-09-25: 0.25 mg via SUBCUTANEOUS
  Filled 2014-09-25: qty 1

## 2014-09-25 NOTE — Progress Notes (Signed)
Patient ID: Andrea Young, female   DOB: Jun 22, 1975, 39 y.o.   MRN: [id number] S: BLOODY DISCHARGE LAST PM   O: AF VSS      GRAVID UTERUS NONTENDER      CERVIX STILL 3 CM 50 %  FOOTLING      MONITOR FHR CAT ONE NO CTX NOTED A: IUP 23.4 WITH LOST OF TWIN A CERVICAL DILATION BREECH P: CONTINUE BEDREST CONSIDER MAGNESIUM IF CTX'S INCREASE      CESAREAN SECTION FOR DELIVERY

## 2014-09-26 ENCOUNTER — Encounter (HOSPITAL_COMMUNITY): Payer: Self-pay | Admitting: Anesthesiology

## 2014-09-26 ENCOUNTER — Inpatient Hospital Stay (HOSPITAL_COMMUNITY): Payer: 59 | Admitting: Anesthesiology

## 2014-09-26 ENCOUNTER — Encounter (HOSPITAL_COMMUNITY)
Admission: AD | Disposition: A | Discharge status: Home / Self Care | Payer: Self-pay | Source: Ambulatory Visit | Attending: Obstetrics and Gynecology

## 2014-09-26 DIAGNOSIS — Z98891 History of uterine scar from previous surgery: Secondary | ICD-10-CM

## 2014-09-26 LAB — CBC
HCT: 33 % — ABNORMAL LOW (ref 36.0–46.0)
Hemoglobin: 10.9 g/dL — ABNORMAL LOW (ref 12.0–15.0)
MCH: 28.8 pg (ref 26.0–34.0)
MCHC: 33 g/dL (ref 30.0–36.0)
MCV: 87.1 fL (ref 78.0–100.0)
Platelets: 219 10*3/uL (ref 150–400)
RBC: 3.79 MIL/uL — AB (ref 3.87–5.11)
RDW: 14.4 % (ref 11.5–15.5)
WBC: 10.4 10*3/uL (ref 4.0–10.5)

## 2014-09-26 LAB — PREPARE RBC (CROSSMATCH)

## 2014-09-26 SURGERY — Surgical Case
Anesthesia: General

## 2014-09-26 MED ORDER — KETOROLAC TROMETHAMINE 30 MG/ML IJ SOLN
INTRAMUSCULAR | Status: AC
  Filled 2014-09-26: qty 1

## 2014-09-26 MED ORDER — LIDOCAINE HCL (CARDIAC) 20 MG/ML IV SOLN
INTRAVENOUS | Status: AC
  Filled 2014-09-26: qty 5

## 2014-09-26 MED ORDER — SIMETHICONE 80 MG PO CHEW
80.0000 mg | CHEWABLE_TABLET | ORAL | Status: DC
  Administered 2014-09-26 – 2014-09-29 (×4): 80 mg via ORAL
  Filled 2014-09-26 (×5): qty 1

## 2014-09-26 MED ORDER — MEPERIDINE HCL 25 MG/ML IJ SOLN: 6.2500 mg | INTRAMUSCULAR | Status: DC | PRN

## 2014-09-26 MED ORDER — PROPOFOL 10 MG/ML IV BOLUS
INTRAVENOUS | Status: AC
  Filled 2014-09-26: qty 20

## 2014-09-26 MED ORDER — FENTANYL CITRATE (PF) 250 MCG/5ML IJ SOLN
INTRAMUSCULAR | Status: AC
  Filled 2014-09-26: qty 25

## 2014-09-26 MED ORDER — MENTHOL 3 MG MT LOZG: 1.0000 | LOZENGE | OROMUCOSAL | Status: DC | PRN

## 2014-09-26 MED ORDER — SODIUM CHLORIDE 0.9 % IV SOLN: Freq: Once | INTRAVENOUS | Status: DC

## 2014-09-26 MED ORDER — PRENATAL MULTIVITAMIN CH
1.0000 | ORAL_TABLET | Freq: Every day | ORAL | Status: DC
  Administered 2014-09-27 – 2014-09-29 (×3): 1 via ORAL
  Filled 2014-09-26 (×3): qty 1

## 2014-09-26 MED ORDER — SENNOSIDES-DOCUSATE SODIUM 8.6-50 MG PO TABS
2.0000 | ORAL_TABLET | ORAL | Status: DC
  Administered 2014-09-26 – 2014-09-29 (×4): 2 via ORAL
  Filled 2014-09-26 (×5): qty 2

## 2014-09-26 MED ORDER — ONDANSETRON HCL 4 MG/2ML IJ SOLN
INTRAMUSCULAR | Status: AC
  Filled 2014-09-26: qty 2

## 2014-09-26 MED ORDER — ACETAMINOPHEN 325 MG PO TABS: 650.0000 mg | ORAL_TABLET | ORAL | Status: DC | PRN

## 2014-09-26 MED ORDER — FENTANYL CITRATE (PF) 100 MCG/2ML IJ SOLN
INTRAMUSCULAR | Status: AC
  Filled 2014-09-26: qty 2

## 2014-09-26 MED ORDER — DIBUCAINE 1 % RE OINT: 1.0000 "application " | TOPICAL_OINTMENT | RECTAL | Status: DC | PRN

## 2014-09-26 MED ORDER — PROPOFOL 10 MG/ML IV BOLUS
INTRAVENOUS | Status: DC | PRN
  Administered 2014-09-26: 200 mg via INTRAVENOUS

## 2014-09-26 MED ORDER — FENTANYL CITRATE (PF) 100 MCG/2ML IJ SOLN
INTRAMUSCULAR | Status: DC | PRN
  Administered 2014-09-26: 50 ug via INTRAVENOUS
  Administered 2014-09-26: 100 ug via INTRAVENOUS
  Administered 2014-09-26: 50 ug via INTRAVENOUS
  Administered 2014-09-26: 100 ug via INTRAVENOUS
  Administered 2014-09-26 (×2): 50 ug via INTRAVENOUS
  Administered 2014-09-26: 100 ug via INTRAVENOUS

## 2014-09-26 MED ORDER — LIDOCAINE HCL (CARDIAC) 20 MG/ML IV SOLN
INTRAVENOUS | Status: DC | PRN
  Administered 2014-09-26: 100 mg via INTRAVENOUS

## 2014-09-26 MED ORDER — LACTATED RINGERS IV SOLN
INTRAVENOUS | Status: DC
Start: 2014-09-26 — End: 2014-09-29
  Administered 2014-09-26 – 2014-09-27 (×2): via INTRAVENOUS

## 2014-09-26 MED ORDER — SCOPOLAMINE 1 MG/3DAYS TD PT72
MEDICATED_PATCH | TRANSDERMAL | Status: DC | PRN
  Administered 2014-09-26: 1 via TRANSDERMAL

## 2014-09-26 MED ORDER — LACTATED RINGERS IV SOLN
2.0000 g/h | INTRAVENOUS | Status: DC
  Filled 2014-09-26: qty 80

## 2014-09-26 MED ORDER — SUCCINYLCHOLINE CHLORIDE 20 MG/ML IJ SOLN
INTRAMUSCULAR | Status: DC | PRN
  Administered 2014-09-26: 140 mg via INTRAVENOUS

## 2014-09-26 MED ORDER — WITCH HAZEL-GLYCERIN EX PADS: 1.0000 "application " | MEDICATED_PAD | CUTANEOUS | Status: DC | PRN

## 2014-09-26 MED ORDER — ONDANSETRON HCL 4 MG/2ML IJ SOLN
INTRAMUSCULAR | Status: DC | PRN
  Administered 2014-09-26: 4 mg via INTRAVENOUS

## 2014-09-26 MED ORDER — OXYCODONE-ACETAMINOPHEN 5-325 MG PO TABS
2.0000 | ORAL_TABLET | ORAL | Status: DC | PRN
  Administered 2014-09-26 – 2014-09-28 (×5): 2 via ORAL
  Administered 2014-09-29: 1 via ORAL
  Administered 2014-09-29: 2 via ORAL
  Filled 2014-09-26 (×5): qty 2

## 2014-09-26 MED ORDER — GLYCOPYRROLATE 0.2 MG/ML IJ SOLN
INTRAMUSCULAR | Status: AC
  Filled 2014-09-26: qty 3

## 2014-09-26 MED ORDER — FENTANYL CITRATE (PF) 100 MCG/2ML IJ SOLN
25.0000 ug | INTRAMUSCULAR | Status: DC | PRN
  Administered 2014-09-26 (×3): 50 ug via INTRAVENOUS

## 2014-09-26 MED ORDER — BUPIVACAINE HCL (PF) 0.25 % IJ SOLN
INTRAMUSCULAR | Status: AC
  Filled 2014-09-26: qty 30

## 2014-09-26 MED ORDER — CEFAZOLIN SODIUM-DEXTROSE 2-3 GM-% IV SOLR
INTRAVENOUS | Status: DC | PRN
  Administered 2014-09-26: 2 g via INTRAVENOUS

## 2014-09-26 MED ORDER — HYDROMORPHONE HCL 1 MG/ML IJ SOLN
0.2500 mg | INTRAMUSCULAR | Status: DC | PRN
  Administered 2014-09-26 (×3): 0.5 mg via INTRAVENOUS

## 2014-09-26 MED ORDER — FENTANYL CITRATE (PF) 100 MCG/2ML IJ SOLN
INTRAMUSCULAR | Status: AC
  Administered 2014-09-26: 50 ug via INTRAVENOUS
  Filled 2014-09-26: qty 2

## 2014-09-26 MED ORDER — TETANUS-DIPHTH-ACELL PERTUSSIS 5-2.5-18.5 LF-MCG/0.5 IM SUSP: 0.5000 mL | Freq: Once | INTRAMUSCULAR | Status: DC

## 2014-09-26 MED ORDER — HYDROMORPHONE HCL 1 MG/ML IJ SOLN
INTRAMUSCULAR | Status: AC
  Administered 2014-09-26: 0.5 mg via INTRAVENOUS
  Filled 2014-09-26: qty 1

## 2014-09-26 MED ORDER — PROMETHAZINE HCL 25 MG/ML IJ SOLN: 6.2500 mg | INTRAMUSCULAR | Status: DC | PRN

## 2014-09-26 MED ORDER — DIPHENHYDRAMINE HCL 25 MG PO CAPS: 25.0000 mg | ORAL_CAPSULE | Freq: Four times a day (QID) | ORAL | Status: DC | PRN

## 2014-09-26 MED ORDER — MAGNESIUM SULFATE BOLUS VIA INFUSION
4.0000 g | Freq: Once | INTRAVENOUS | Status: AC
  Administered 2014-09-26: 4 g via INTRAVENOUS
  Filled 2014-09-26: qty 500

## 2014-09-26 MED ORDER — OXYCODONE-ACETAMINOPHEN 5-325 MG PO TABS
1.0000 | ORAL_TABLET | ORAL | Status: DC | PRN
Start: 2014-09-26 — End: 2014-09-30
  Administered 2014-09-27 – 2014-09-28 (×2): 1 via ORAL
  Filled 2014-09-26 (×6): qty 1

## 2014-09-26 MED ORDER — HYDROMORPHONE HCL 1 MG/ML IJ SOLN
INTRAMUSCULAR | Status: AC
  Filled 2014-09-26: qty 1

## 2014-09-26 MED ORDER — ROCURONIUM BROMIDE 100 MG/10ML IV SOLN
INTRAVENOUS | Status: AC
  Filled 2014-09-26: qty 1

## 2014-09-26 MED ORDER — IBUPROFEN 600 MG PO TABS
600.0000 mg | ORAL_TABLET | Freq: Four times a day (QID) | ORAL | Status: DC
  Administered 2014-09-26 – 2014-09-30 (×15): 600 mg via ORAL
  Filled 2014-09-26 (×15): qty 1

## 2014-09-26 MED ORDER — CITRIC ACID-SODIUM CITRATE 334-500 MG/5ML PO SOLN
ORAL | Status: AC
  Administered 2014-09-26: 10:00:00
  Filled 2014-09-26: qty 15

## 2014-09-26 MED ORDER — LACTATED RINGERS IV SOLN
INTRAVENOUS | Status: DC | PRN
  Administered 2014-09-26 (×3): via INTRAVENOUS

## 2014-09-26 MED ORDER — MIDAZOLAM HCL 2 MG/2ML IJ SOLN
INTRAMUSCULAR | Status: DC | PRN
  Administered 2014-09-26: 2 mg via INTRAVENOUS

## 2014-09-26 MED ORDER — ZOLPIDEM TARTRATE 5 MG PO TABS
5.0000 mg | ORAL_TABLET | Freq: Every evening | ORAL | Status: DC | PRN
  Administered 2014-09-26 – 2014-09-29 (×4): 5 mg via ORAL
  Filled 2014-09-26 (×4): qty 1

## 2014-09-26 MED ORDER — MIDAZOLAM HCL 2 MG/2ML IJ SOLN
INTRAMUSCULAR | Status: AC
  Filled 2014-09-26: qty 4

## 2014-09-26 MED ORDER — SIMETHICONE 80 MG PO CHEW: 80.0000 mg | CHEWABLE_TABLET | ORAL | Status: DC | PRN

## 2014-09-26 MED ORDER — DEXAMETHASONE SODIUM PHOSPHATE 4 MG/ML IJ SOLN
INTRAMUSCULAR | Status: AC
  Filled 2014-09-26: qty 1

## 2014-09-26 MED ORDER — NEOSTIGMINE METHYLSULFATE 10 MG/10ML IV SOLN
INTRAVENOUS | Status: AC
  Filled 2014-09-26: qty 1

## 2014-09-26 MED ORDER — DEXAMETHASONE SODIUM PHOSPHATE 10 MG/ML IJ SOLN
INTRAMUSCULAR | Status: DC | PRN
  Administered 2014-09-26: 10 mg via INTRAVENOUS

## 2014-09-26 MED ORDER — SIMETHICONE 80 MG PO CHEW
80.0000 mg | CHEWABLE_TABLET | Freq: Three times a day (TID) | ORAL | Status: DC
  Administered 2014-09-27 – 2014-09-30 (×10): 80 mg via ORAL
  Filled 2014-09-26 (×10): qty 1

## 2014-09-26 MED ORDER — OXYTOCIN 40 UNITS IN LACTATED RINGERS INFUSION - SIMPLE MED: 62.5000 mL/h | INTRAVENOUS | Status: AC

## 2014-09-26 MED ORDER — LANOLIN HYDROUS EX OINT: 1.0000 "application " | TOPICAL_OINTMENT | CUTANEOUS | Status: DC | PRN

## 2014-09-26 SURGICAL SUPPLY — 35 items
APL SKNCLS STERI-STRIP NONHPOA (GAUZE/BANDAGES/DRESSINGS) ×1
BARRIER ADHS 3X4 INTERCEED (GAUZE/BANDAGES/DRESSINGS) IMPLANT
BENZOIN TINCTURE PRP APPL 2/3 (GAUZE/BANDAGES/DRESSINGS) ×2 IMPLANT
BRR ADH 4X3 ABS CNTRL BYND (GAUZE/BANDAGES/DRESSINGS)
CLAMP CORD UMBIL (MISCELLANEOUS) IMPLANT
CLOSURE WOUND 1/2 X4 (GAUZE/BANDAGES/DRESSINGS) ×1
CLOTH BEACON ORANGE TIMEOUT ST (SAFETY) ×3 IMPLANT
CONTAINER PREFILL 10% NBF 15ML (MISCELLANEOUS) IMPLANT
DRAPE SHEET LG 3/4 BI-LAMINATE (DRAPES) IMPLANT
DRSG OPSITE POSTOP 4X10 (GAUZE/BANDAGES/DRESSINGS) ×3 IMPLANT
DURAPREP 26ML APPLICATOR (WOUND CARE) ×3 IMPLANT
ELECT REM PT RETURN 9FT ADLT (ELECTROSURGICAL) ×3
ELECTRODE REM PT RTRN 9FT ADLT (ELECTROSURGICAL) ×1 IMPLANT
EXTRACTOR VACUUM M CUP 4 TUBE (SUCTIONS) IMPLANT
EXTRACTOR VACUUM M CUP 4' TUBE (SUCTIONS)
GLOVE BIO SURGEON STRL SZ 6.5 (GLOVE) ×2 IMPLANT
GLOVE BIO SURGEONS STRL SZ 6.5 (GLOVE) ×1
GOWN STRL REUS W/TWL LRG LVL3 (GOWN DISPOSABLE) ×6 IMPLANT
KIT ABG SYR 3ML LUER SLIP (SYRINGE) IMPLANT
NDL HYPO 25X5/8 SAFETYGLIDE (NEEDLE) ×1 IMPLANT
NEEDLE HYPO 22GX1.5 SAFETY (NEEDLE) IMPLANT
NEEDLE HYPO 25X5/8 SAFETYGLIDE (NEEDLE) ×3 IMPLANT
NS IRRIG 1000ML POUR BTL (IV SOLUTION) ×3 IMPLANT
PACK C SECTION WH (CUSTOM PROCEDURE TRAY) ×3 IMPLANT
PAD OB MATERNITY 4.3X12.25 (PERSONAL CARE ITEMS) ×3 IMPLANT
STRIP CLOSURE SKIN 1/2X4 (GAUZE/BANDAGES/DRESSINGS) ×1 IMPLANT
SUT CHROMIC 0 CTX 36 (SUTURE) ×10 IMPLANT
SUT PLAIN 0 NONE (SUTURE) IMPLANT
SUT PLAIN 2 0 XLH (SUTURE) IMPLANT
SUT VIC AB 0 CT1 27 (SUTURE) ×9
SUT VIC AB 0 CT1 27XBRD ANBCTR (SUTURE) ×3 IMPLANT
SUT VIC AB 4-0 KS 27 (SUTURE) IMPLANT
SYR CONTROL 10ML LL (SYRINGE) IMPLANT
TOWEL OR 17X24 6PK STRL BLUE (TOWEL DISPOSABLE) ×3 IMPLANT
TRAY FOLEY CATH SILVER 14FR (SET/KITS/TRAYS/PACK) ×3 IMPLANT

## 2014-09-26 NOTE — Transfer of Care (Signed)
Immediate Anesthesia Transfer of Care Note  Patient: Andrea Young  Procedure(s) Performed: Procedure(s): CESAREAN SECTION (N/A)  Patient Location: PACU  Anesthesia Type:General  Level of Consciousness: awake, alert , oriented and patient cooperative  Airway & Oxygen Therapy: Patient Spontanous Breathing and Patient connected to nasal cannula oxygen  Post-op Assessment: Report given to RN and Post -op Vital signs reviewed and stable  Post vital signs: Reviewed and stable  Last Vitals:  TEMP 98.1 BP 113/59 HR 95 RR 18 O2SAT 99  Complications:

## 2014-09-26 NOTE — Lactation Note (Signed)
This note was copied from the chart of Andrea Zoraya Fiorenza. Lactation Consultation Note  Patient Name: Andrea Young ZOXWR'U Date: 09/26/2014 Reason for consult: Initial assessment;NICU baby NICU baby 5 hours old. Set mom up with DEBP and she started pumping. Enc mom to pump 8 times/24 hours for 15 minutes. Enc mom to sleep at night and then increase pumping times in the morning. Discussed normal progression of milk coming to volume and enc mom to ask for assistance as needed. Mom given NICU booklet with review, and aware of pumping rooms in NICU. Mom given [facility] brochure, aware of OP/BFSG, community resources, and [facility] phone line assistance after D/C.   Maternal Data    Feeding    LATCH Score/Interventions                      Lactation Tools Discussed/Used Pump Review: Setup, frequency, and cleaning;Milk Storage Initiated by:: JW Date initiated:: 09/26/14   Consult Status Consult Status: Follow-up Date: 09/27/14 Follow-up type: In-patient    Geralynn Ochs 09/26/2014, 3:49 PM

## 2014-09-26 NOTE — Progress Notes (Signed)
Patient has continued to bleed with passage of clots about quarter sized.  Cervix is 90% 4 to 5 cm  I can palpate fetal parts / feet at level of external os  Moderate amount of blood on gloved hand  Will proceed with C Section given above developments OR notified

## 2014-09-26 NOTE — Anesthesia Procedure Notes (Signed)
Procedure Name: Intubation Date/Time: 09/26/2014 10:26 AM Performed by: Collier Flowers Pre-anesthesia Checklist: Patient identified, Emergency Drugs available, Suction available, Patient being monitored and Timeout performed Patient Re-evaluated:Patient Re-evaluated prior to inductionOxygen Delivery Method: Circle system utilized Preoxygenation: Pre-oxygenation with 100% oxygen Intubation Type: IV induction Laryngoscope Size: Mac and 3 Grade View: Grade I Tube type: Oral Tube size: 7.0 mm Number of attempts: 1 Airway Equipment and Method: Stylet Placement Confirmation: ETT inserted through vocal cords under direct vision,  positive ETCO2 and CO2 detector Secured at: 21 cm Tube secured with: Tape Dental Injury: Teeth and Oropharynx as per pre-operative assessment

## 2014-09-26 NOTE — Brief Op Note (Signed)
09/16/2014 - 09/26/2014  10:57 AM  PATIENT:  Andrea Young  39 y.o. female  PRE-OPERATIVE DIAGNOSIS:  IUP at 16 w 5 days Previous Twin Pregnancy with previous PPROM and delivery of TWIN A Breech presentation PTL Possible Placental abruption  POST-OPERATIVE DIAGNOSIS:   Same Placental Abruption  PROCEDURE:  Procedure(s): CESAREAN SECTION (N/A)  SURGEON:  Surgeon(s) and Role:    * Marcelle Overlie, MD - Primary  PHYSICIAN ASSISTANT:   ASSISTANTS: none   ANESTHESIA:   general  EBL:  Total I/O In: 1400 [I.V.:1400] Out: 700 [Urine:200; Blood:500]  BLOOD ADMINISTERED:none  DRAINS: Urinary Catheter (Foley)   LOCAL MEDICATIONS USED:  NONE  SPECIMEN:  Source of Specimen:  placenta  DISPOSITION OF SPECIMEN:  PATHOLOGY  COUNTS:  YES  TOURNIQUET:  * No tourniquets in log *  DICTATION: .Other Dictation: Dictation Number [id number]  PLAN OF CARE: Admit to inpatient   PATIENT DISPOSITION:  PACU - hemodynamically stable.   Delay start of Pharmacological VTE agent (>24hrs) due to surgical blood loss or risk of bleeding: not applicable

## 2014-09-26 NOTE — Anesthesia Postprocedure Evaluation (Signed)
  Anesthesia Post-op Note  Patient: Andrea Young  Procedure(s) Performed: Procedure(s): CESAREAN SECTION (N/A)  Patient Location: PACU  Anesthesia Type:General  Level of Consciousness: awake  Airway and Oxygen Therapy: Patient Spontanous Breathing  Post-op Pain: mild  Post-op Assessment: Post-op Vital signs reviewed, Patient's Cardiovascular Status Stable, Respiratory Function Stable, Patent Airway and No signs of Nausea or vomiting              Post-op Vital Signs: Reviewed and stable  Last Vitals:  Filed Vitals:   09/26/14 1130  BP: 124/74  Pulse:   Temp: 36.7 C  Resp: 16    Complications: No apparent anesthesia complications

## 2014-09-26 NOTE — Progress Notes (Signed)
Patient is feeling better after Magnesium initiated.  FHR Bseline 150s with good variability for gestational age  Toco UCs have decreased  Cervix is unchanged  No fresh bleeding in past hour  IMPRESSION: IUP at 23 w 5 days BREECH Possible Abruption/PTL  PLAN: Plan reviewed with patient and her husband Patient currently stable Given clinical situation is stable I would not move to delivery right now Continue Magnesium Continuous monitoring

## 2014-09-26 NOTE — Progress Notes (Signed)
Andrea Young was lying in bed when arrived. She said she was doing ok; admitted to being a little sore from surgery. She said baby is doing well and the nurses described her as feisty.  She was very appreciative of visit and prayer. Will continue to visit; however please page if she needs Chaplain support at any time. Chaplain Elmarie Shiley Holder   09/26/14 1900  Clinical Encounter Type  Visited With Patient

## 2014-09-26 NOTE — Progress Notes (Signed)
Chaplain responded to the request of the Nursing Unit to provide pastoral support to patient who has been a patient for the past two weeks after the fetal demise of one of twin babies.  The chaplain presented to the patient, husband, and other family member to offer words of encouragement and prayer of comfort for the patient and continued healing of infant child delivered prematurely.  Chaplain will continue to follow as needed for pastoral support Chaplain Janell Quiet [phone]

## 2014-09-26 NOTE — Anesthesia Preprocedure Evaluation (Signed)
Anesthesia Evaluation  Patient identified by MRN, date of birth, ID band Patient awake    Reviewed: Allergy & Precautions, NPO status , Patient's Chart, lab work & pertinent test results  History of Anesthesia Complications (+) PONV  Airway Mallampati: II   Neck ROM: Full    Dental  (+) Dental Advisory Given, Teeth Intact   Pulmonary neg pulmonary ROS,  breath sounds clear to auscultation        Cardiovascular negative cardio ROS  Rhythm:Regular     Neuro/Psych negative neurological ROS  negative psych ROS   GI/Hepatic negative GI ROS, Neg liver ROS,   Endo/Other  negative endocrine ROS  Renal/GU negative Renal ROS     Musculoskeletal   Abdominal (+)  Abdomen: soft.    Peds  Hematology 11/34  plts 219 09/26/2014 8:am   Anesthesia Other Findings   Reproductive/Obstetrics IUP twins 23 weeks one delivered still,other possible abruption                             Anesthesia Physical Anesthesia Plan  ASA: II  Anesthesia Plan: Spinal and General   Post-op Pain Management:    Induction:   Airway Management Planned: Natural Airway and Oral ETT  Additional Equipment:   Intra-op Plan:   Post-operative Plan:   Informed Consent: I have reviewed the patients History and Physical, chart, labs and discussed the procedure including the risks, benefits and alternatives for the proposed anesthesia with the patient or authorized representative who has indicated his/her understanding and acceptance.     Plan Discussed with:   Anesthesia Plan Comments:         Anesthesia Quick Evaluation

## 2014-09-26 NOTE — Progress Notes (Signed)
Pt to stretcher then to OR

## 2014-09-26 NOTE — Progress Notes (Signed)
Patient started having mucous discharge mixed with blood yesterday.  Evaluated by Dr. Arelia Sneddon and cervix was unchanged.  This am, however, Patient woke up feeling severe cramps in left lower abdomen - coming and going.  She also has soaked one pad this am.  FHR baseline 150s with normal variability for gestational age.  Periods of decelerations noted overnight and contractions now every 4 to 5 minutes. Last contraction with late appearing deceleration  Exam Cervix is 80% 4 cm BBOW   Blood on glove with small clot  IMPRESSION: IUP at 23 w 5 days BREECH Possible abruption / PTL  PLAN: Insert IV Start Magnesium for neuroprotection INFORM OR If contractions do not space or bleeding does not resolve will proceed with Primary LTCS  Patient counseled all questions are answered

## 2014-09-27 ENCOUNTER — Encounter (HOSPITAL_COMMUNITY): Payer: Self-pay | Admitting: Obstetrics and Gynecology

## 2014-09-27 LAB — CBC
HEMATOCRIT: 31.6 % — AB (ref 36.0–46.0)
Hemoglobin: 10.3 g/dL — ABNORMAL LOW (ref 12.0–15.0)
MCH: 28.7 pg (ref 26.0–34.0)
MCHC: 32.6 g/dL (ref 30.0–36.0)
MCV: 88 fL (ref 78.0–100.0)
Platelets: 192 10*3/uL (ref 150–400)
RBC: 3.59 MIL/uL — ABNORMAL LOW (ref 3.87–5.11)
RDW: 14.5 % (ref 11.5–15.5)
WBC: 8.7 10*3/uL (ref 4.0–10.5)

## 2014-09-27 MED ORDER — CLINDAMYCIN PHOSPHATE 900 MG/50ML IV SOLN
900.0000 mg | Freq: Three times a day (TID) | INTRAVENOUS | Status: DC
  Administered 2014-09-27 – 2014-09-29 (×7): 900 mg via INTRAVENOUS
  Filled 2014-09-27 (×8): qty 50

## 2014-09-27 MED ORDER — SODIUM CHLORIDE 0.9 % IV SOLN
3.0000 g | Freq: Four times a day (QID) | INTRAVENOUS | Status: DC
  Administered 2014-09-27 – 2014-09-29 (×9): 3 g via INTRAVENOUS
  Filled 2014-09-27 (×10): qty 3

## 2014-09-27 MED ORDER — ACETAMINOPHEN 325 MG PO TABS
650.0000 mg | ORAL_TABLET | Freq: Once | ORAL | Status: AC
  Administered 2014-09-27: 650 mg via ORAL

## 2014-09-27 NOTE — Op Note (Signed)
NAMECAMBREY, Andrea Young              ACCOUNT NO.:  [id number]  MEDICAL RECORD NO.:  [id number]  LOCATION:  9317                          FACILITY:  [facility]  PHYSICIAN:  Kailoni Vahle L. Alyne Martinson, M.D.DATE OF BIRTH:  May 10, 1975  DATE OF PROCEDURE:  09/26/2014 DATE OF DISCHARGE:                              OPERATIVE REPORT   PREOPERATIVE DIAGNOSIS:  Intrauterine pregnancy at 23 weeks and 5 days. Previous twin pregnancy with previous PPROM and delivery of twin A breech presentation, preterm labor, and possible placental abruption.  POSTOPERATIVE DIAGNOSIS:  Intrauterine pregnancy at 23 weeks and 5 days. Previous twin pregnancy with previous PPROM and delivery of twin A breech presentation, preterm labor, and placental abruption.  PROCEDURE:  Cesarean section.  SURGEON:  Kodi Steil L. Vincente Poli, M.D.  ANESTHESIA:  General.  EBL:  500 mL.  COMPLICATIONS:  None.  DRAINS:  Foley catheter.  PROCEDURE IN DETAIL:  The patient was taken to the operating room.  She was intubated after stat section was called.  She was prepped and draped with Betadine.  A Foley catheter had been inserted.  A low-transverse incision was made with a scalpel and carried down to the fascia.  Fascia was scored and extended laterally.  The rectus muscles were separated, the midline was identified and entered bluntly using Hemostats.  The peritoneal incision was then stretched.  The bladder blade was inserted. A low-transverse incision was made in the uterus to avoid the bladder. Upon entry into the uterine cavity with a hemostat, it was noted there was a moderate amount of clot.  This baby's placenta appeared to be very ragged and appeared to be consistent with placental abruption and almost appeared to be transected into.  The baby was then double footling breech presentation was delivered easily.  The cord was clamped and cut and the baby was handed to the awaiting Neonatal team.  The uterus was exteriorized, the placenta  was removed and sent to pathology.  There was no cord blood that was able to be obtained.  The uterus was cleared of all clots and debris.  The uterine incision was closed in 2 layers using 0 chromic in a running, locked stitch.  The uterus was returned to the abdomen.  The irrigations were performed, hemostasis was excellent.  The peritoneum was closed using 0 Vicryl.  The fascia closed using 0 Vicryl starting at each corner, meeting in the midline in a running stitch.  After irrigation of skin, the skin was closed with 4-0 Vicryl on a Keith needle.  Steri-Strips were applied.  A honeycomb dressing was applied.  All sponge, lap, and instrument counts were correct x2.  Patient went to recovery room in stable condition.     Nickson Middlesworth L. Vincente Poli, M.D.     Florestine Avers  D:  09/26/2014  T:  09/27/2014  Job:  [id number]

## 2014-09-27 NOTE — Addendum Note (Signed)
Addendum  created 09/27/14 0751 by Jhonnie Garner, CRNA   Modules edited: Notes Section   Notes Section:  File: [id number]

## 2014-09-27 NOTE — Progress Notes (Signed)
Subjective: Postpartum Day 1: Cesarean Delivery Patient reports tolerating PO and no problems voiding.  Baby 23 weeks in NICU; +GNR on blood cx.  Patient became tachycardic this AM and T 100.3.    Objective: Vital signs in last 24 hours: Temp:  [97.6 F (36.4 C)-100.3 F (37.9 C)] 100.3 F (37.9 C) (08/09 0755) Pulse Rate:  [77-107] 107 (08/09 0755) Resp:  [16-27] 17 (08/09 0157) BP: (104-144)/(56-105) 110/68 mmHg (08/09 0543) SpO2:  [96 %-100 %] 96 % (08/09 0755)  Physical Exam:  General: alert, cooperative and appears stated age 39: appropriate Uterine Fundus: firm Incision: healing well, no significant drainage, no dehiscence DVT Evaluation: No evidence of DVT seen on physical exam. Negative Homan's sign. No cords or calf tenderness.   Recent Labs  09/26/14 0818 09/27/14 0535  HGB 10.9* 10.3*  HCT 33.0* 31.6*    Assessment/Plan: Status post Cesarean section. Doing well postoperatively.  Continue current care. Endometritis-blood cx pending.  D1 of Unasyn/Clinda.    Andrea Young 09/27/2014, 8:46 AM

## 2014-09-27 NOTE — Progress Notes (Signed)
CSW attempted to meet with parents to offer support and complete assessment due to NICU admission at 23.5 weeks, but MOB was in the shower at this time.  CSW will attempt again at a later time. 

## 2014-09-27 NOTE — Anesthesia Postprocedure Evaluation (Signed)
Anesthesia Post Note  Patient: Andrea Young  Procedure(s) Performed: Procedure(s) (LRB): CESAREAN SECTION (N/A)  Anesthesia type: General  Patient location: Mother/Baby  Post pain: Pain level controlled  Post assessment: Post-op Vital signs reviewed  Last Vitals:  Filed Vitals:   09/27/14 0656  BP:   Pulse:   Temp: 37.8 C  Resp:     Post vital signs: Reviewed  Level of consciousness: awake and alert   Complications: No apparent anesthesia complications

## 2014-09-27 NOTE — Lactation Note (Signed)
This note was copied from the chart of Andrea Georgeanne Frankland. Lactation Consultation Note  Patient Name: Andrea Young ZOXWR'U Date: 09/27/2014 Reason for consult: Follow-up assessment;NICU baby NICU baby 66 hours old. Mom reports that she is pumping 8 times/24 hours and did get one small drop of colostrum, but was not able to collect the drop. Discussed with parents to continue pumping 8 times/24 hours for 15 minutes, followed by hand expression, and then take EBM to NICU for baby even if it is just one drop. Discussed that a drop can be swabbed inside baby's mouth to colonize the gut. Discussed that EBM can stay at bedside, covered, up to 4 hours, but needs to go to NICU or be refrigerated before 4 hours has elapsed. Enc mom to call for assistance as needed.  Maternal Data    Feeding    LATCH Score/Interventions                      Lactation Tools Discussed/Used     Consult Status Consult Status: Follow-up Date: 09/28/14 Follow-up type: In-patient    Geralynn Ochs 09/27/2014, 4:42 PM

## 2014-09-28 LAB — RPR: RPR: NONREACTIVE

## 2014-09-28 NOTE — Progress Notes (Deleted)
Interval Note:  Notified of recent blood gas with rising CO2 in the 90's. Sats down in the 60's. On auscultation, infant has poor breath sounds, CXR with severe RDS, ETT in good position, 8 rib expansion. PPV done without change in stas. Infant placed back on HFOV with increased PIP. Sats slowly rose to 90's. Will wean slowly and monitor closely.  1. Consider giving 4th dose of surfactant. 2. Give FFP 15 ml/k due to hx of blood tinged secretions from ETT with suctioning. Suspect some degree of DIC  due to E Coli sepsis but prefer to treat impirically as DIC screen requires a lot of blood volume. 3. Change antibiotics to Gent/Ceftaz. As discussed with Dr Katrinka Blazing, infant is too unstable for an LP to evaluate for meningitis therefore will cover for possible meningitis until able to obtain CSF studies. Pharmacy consult requested.  Impression: 23 wk extremely premature infant with severe RDS and E coli sepsis, hypotension, and suspected DIC. Infant is extremely critical and unstable. Follow for signs of pulmonary hpn. Will follow closely.  Lucillie Garfinkel, MD Neonatologist on call

## 2014-09-28 NOTE — Clinical Social Work Maternal (Signed)
  CLINICAL SOCIAL WORK MATERNAL/CHILD NOTE  Patient Details  Name: Andrea Young MRN: [id number] Date of Birth: 1975/09/02  Date:  09/28/2014  Clinical Social Worker Initiating Note:  Lei Dower E. Brigitte Pulse, [place] Date/ Time Initiated:  09/28/14/1130     Child's Name:  Andrea Young   Legal Guardian:   (Parents: Lovett Sox and Wynne Dust)   Need for Interpreter:  None   Date of Referral:        Reason for Referral:   (No referral-NICU admission)   Referral Source:      Address:  [address] [place], [place] [address]  Phone number:  [phone]   Household Members:  Spouse   Natural Supports (not living in the home):  Church, Extended Family, Immediate Family (MOB states they have supportive families, but that their families live in [place].  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[place] [employer] )   Education:      Museum/gallery curator Resources:  Multimedia programmer   Other Resources:      Cultural/Religious Considerations Which May Impact Care: "Huge church family."  Strengths:  Understanding of illness, Compliance with medical plan    Risk Factors/Current Problems:  Adjustment to Illness  (MOB reports having "a difficult pregnancy.")   Cognitive State:  Alert , Linear Thinking , Insightful    Mood/Affect:  Calm , Other (Comment) (Thankful)   CSW Assessment: CSW met with MOB and her mother in her third floor room/317 to introduce myself, offer support, and complete assessment due to NICU admission at 23.5 weeks.  MOB seemed somewhat resistant to the visit initially, but invited CSW into her room.  She presents in a pleasant mood and reports that she is "good."  The way MOB responded to the question, "how are you doing?" makes it seem to CSW that she has made a conscious decision to be "good."  MOB  reports that "this has been a difficult pregnancy.  I am just thankful that Carsen made it to viability and is here."  CSW validated feelings and provided supportive counseling.  MOB did not appear ready or willing to further process feelings related to her difficult pregnancy or baby's premature birth at this time, which CSW respects.  CSW informed MOB and MGM of ongoing support services offered by NICU CSW and the importance of emotional care as well as physical.  MOB agreed and was appreciative.   CSW attempted to build rapport through talking about MOB's job in the [employer] system.  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  She reports having great support from her husband, family in [place], and church.  MGM states she has been here this week and plans to be here on the weekends moving forward.   CSW provided contact information and asked to follow up with family as needed/desired.  MOB agreed and thanked CSW.  CSW Plan/Description:  Patient/Family Education , Psychosocial Support and Ongoing Assessment of Needs    Kalman Shan 09/28/2014, 12:34 PM

## 2014-09-28 NOTE — Progress Notes (Signed)
This was a follow up visit with Andrea Young and her family whom Spiritual Care has been working with since the loss of her twin.  Today, family expressed gratitude that their baby has made it to this point and that their baby was able to be admitted to NICU.  They did not give indication that they wanted to discuss anything more at this time.  They are aware of our ongoing availability for support and I will plan to follow up with them over the next day or so.  [address] Dyanne Carrel, Bcc Pager, [phone] 10:23 AM    09/28/14 1000  Clinical Encounter Type  Visited With Family;Patient  Visit Type Follow-up  Referral From Chaplain;Nurse;Social work  Spiritual Encounters  Spiritual Needs Emotional

## 2014-09-28 NOTE — Progress Notes (Signed)
Subjective: Postpartum Day 2: Cesarean Delivery Patient reports tolerating PO, + flatus and no problems voiding.    Objective: Vital signs in last 24 hours: Temp:  [98.2 F (36.8 C)-100.9 F (38.3 C)] 99.6 F (37.6 C) (08/10 0742) Pulse Rate:  [92-110] 102 (08/10 0519) Resp:  [16-18] 17 (08/10 0519) BP: (96-122)/(53-73) 116/71 mmHg (08/10 0519) SpO2:  [92 %-100 %] 99 % (08/10 0519)  Physical Exam:  General: alert and cooperative Lochia: appropriate Uterine Fundus: firm,slightly tender Incision: healing well DVT Evaluation: No evidence of DVT seen on physical exam. Negative Homan's sign. No cords or calf tenderness.   Recent Labs  09/26/14 0818 09/27/14 0535  HGB 10.9* 10.3*  HCT 33.0* 31.6*   Blood cultures pending, baby + for E. coli Assessment/Plan: Status post Cesarean section. Postoperative course complicated by endometitis  Continue current care.  Xenia Nile G 09/28/2014, 8:25 AM

## 2014-09-29 LAB — TYPE AND SCREEN
ABO/RH(D): O POS
ANTIBODY SCREEN: NEGATIVE
Unit division: 0

## 2014-09-29 MED ORDER — AMOXICILLIN-POT CLAVULANATE 875-125 MG PO TABS
1.0000 | ORAL_TABLET | Freq: Two times a day (BID) | ORAL | Status: DC
  Administered 2014-09-29 – 2014-09-30 (×3): 1 via ORAL
  Filled 2014-09-29 (×3): qty 1

## 2014-09-29 MED ORDER — MAGNESIUM CITRATE PO SOLN
1.0000 | Freq: Once | ORAL | Status: AC
  Administered 2014-09-29: 1 via ORAL
  Filled 2014-09-29: qty 296

## 2014-09-29 NOTE — Progress Notes (Signed)
Subjective: Postpartum Day 3: Cesarean Delivery Patient reports tolerating PO, + flatus and no problems voiding.    Objective: Vital signs in last 24 hours: Temp:  [97.7 F (36.5 C)-100.8 F (38.2 C)] 98.4 F (36.9 C) (08/11 0533) Pulse Rate:  [74-109] 97 (08/11 0533) Resp:  [18] 18 (08/11 0533) BP: (100-112)/(58-74) 108/74 mmHg (08/11 0533) SpO2:  [96 %-100 %] 100 % (08/11 0533)  Physical Exam:  General: alert, cooperative and no distress Lochia: appropriate Uterine Fundus: FF but tender Incision: healing well DVT Evaluation: No evidence of DVT seen on physical exam.   Recent Labs  09/27/14 0535  HGB 10.3*  HCT 31.6*    Assessment/Plan: Status post Cesarean section. Postoperative course complicated by endometritis  Continue current care Will stop Unasyn/Clindamycin and begin Augmentin Observe until AM.  Orlandria Kissner II,Alvy Alsop E 09/29/2014, 8:36 AM

## 2014-09-29 NOTE — Lactation Note (Signed)
This note was copied from the chart of Andrea Young. Lactation Consultation Note  Follow up visit made.  Mom is teary eyed and verbalizing stress when seeing her baby in the NICU.  Support given.  She was concerned that she had obtained 5 mls but then nothing with her last pumping.  Reassured that amount obtained will be inconsistent.  Instructed mom on breast massage and hand expression.  Several drops of colostrum obtained.  Assisted mom with pumping and encouraged her to do her best relaxing.  Recommended pumping every 3 hours x 15 minutes followed by hand expression.  Mom requesting more privacy when pumping and sign placed on door.  Patient Name: Andrea Severina Sykora [id number] Date: 09/29/2014     Maternal Data    Feeding    LATCH Score/Interventions                      Lactation Tools Discussed/Used     Consult Status      Huston Foley 09/29/2014, 3:13 PM

## 2014-09-29 NOTE — Progress Notes (Signed)
After missing Lacina in her room, I found her with husband leaving NICU. She said baby Carsyn (not sure of spelling) is fighting and had a better day today. They seemed optimistic regarding her condition and asked for continued prayers. Please page Chaplain if conditions change. Chaplain Elmarie Shiley Holder   09/28/14 2000  Clinical Encounter Type  Visited With Patient  Visit Type Follow-up

## 2014-09-29 NOTE — Progress Notes (Signed)
Chaplain has made several attempts to provide pastoral visit with patient but has been unsuccessful.  Chaplain has spoken to the patient's husband who is very appreciative of all spiritual support, prayers, and comfort measures extended to his family, and welcomes the support to continue.  Chaplain also provided pastoral visit to the infant on NICU and will continue to follow as needed. Chaplain Janell Quiet [phone]

## 2014-09-30 MED ORDER — OXYCODONE-ACETAMINOPHEN 5-325 MG PO TABS: 1.0000 | ORAL_TABLET | ORAL | Status: DC | PRN

## 2014-09-30 MED ORDER — AMOXICILLIN-POT CLAVULANATE 875-125 MG PO TABS: 1.0000 | ORAL_TABLET | Freq: Two times a day (BID) | ORAL | Status: DC

## 2014-09-30 MED ORDER — IBUPROFEN 600 MG PO TABS: 600.0000 mg | ORAL_TABLET | Freq: Four times a day (QID) | ORAL | Status: DC

## 2014-09-30 NOTE — Progress Notes (Signed)
Pt discharged to home with husband.  Condition stable.  Pt ambulated to car with RN.  No equipment for home ordered at discharge. 

## 2014-09-30 NOTE — Lactation Note (Signed)
This note was copied from the chart of [name]. Lactation Consultation Note; F/U visit with mom before she is DC. Reports breasts are feeling heavier this morning. Mom reports she obtained about 10 cc's EBM this morning and is pleased to be getting more. Has already taken it to NICU. Reports she is pumping q 3 hours except when she slept to 4 hours last night.Encouragement given. Plans to get pump from Lactation office- is [employer] employee. Reviewed our pumping rooms in NICU- she can bring her pieces and pump while she is here visiting baby. No further questions at present. To call prn  Patient Name: [name] [id number] Date: 09/30/2014     Maternal Data    Feeding    LATCH Score/Interventions                      Lactation Tools Discussed/Used     Consult Status      Pamelia Hoit 09/30/2014, 9:26 AM

## 2014-09-30 NOTE — Discharge Summary (Signed)
Obstetric Discharge Summary Reason for Admission: onset of labor Prenatal Procedures: ultrasound Intrapartum Procedures: cesarean: low cervical, transverse Postpartum Procedures: none Complications-Operative and Postpartum: endometritis HEMOGLOBIN  Date Value Ref Range Status  09/27/2014 10.3* 12.0 - 15.0 Young/dL Final   HCT  Date Value Ref Range Status  09/27/2014 31.6* 36.0 - 46.0 % Final    Physical Exam:  General: alert and cooperative Lochia: appropriate Uterine Fundus: firm Incision: healing well DVT Evaluation: No evidence of DVT seen on physical exam. Negative Homan's sign. No cords or calf tenderness. No significant calf/ankle edema.  Discharge Diagnoses: s/p cesarean delivery secondary to placnetal abruption of viable F infant at 23 weeks  Discharge Information: Date: 09/30/2014 Activity: pelvic rest Diet: routine Medications: PNV, Ibuprofen, Percocet and Augmentin Condition: stable Instructions: refer to practice specific booklet Discharge to: home   Newborn Data: Live born female  Birth Weight: 1 lb 4.8 oz (590 Young) APGAR: 2, 6  Baby in NICU  Andrea Young 09/30/2014, 9:02 AM

## 2014-09-30 NOTE — Progress Notes (Signed)
Subjective: Postpartum Day 4: Cesarean Delivery Patient reports tolerating PO, + BM and no problems voiding.  Baby continues on vent. Settings lowered during the night.  Objective: Vital signs in last 24 hours: Temp:  [97.9 F (36.6 C)-100.6 F (38.1 C)] 97.9 F (36.6 C) (08/12 0524) Pulse Rate:  [70-102] 70 (08/12 0524) Resp:  [18] 18 (08/12 0524) BP: (110-116)/(66-70) 115/66 mmHg (08/12 0524) SpO2:  [100 %] 100 % (08/12 0524)  Physical Exam:  General: alert and cooperative Lochia: appropriate Uterine Fundus: firm, slightly tender R of midline superior to incision site. No erythema noted. Not warm to touch Incision: healing well DVT Evaluation: No evidence of DVT seen on physical exam. Negative Homan's sign. No cords or calf tenderness. No significant calf/ankle edema.  No results for input(s): HGB, HCT in the last 72 hours.  Assessment/Plan: Status post Cesarean section. Postoperative course complicated by endometritis  Discharge home with standard precautions and return to clinic in 1 week.  Evon Dejarnett G 09/30/2014, 8:55 AM

## 2014-10-02 LAB — CULTURE, BLOOD (ROUTINE X 2)
CULTURE: NO GROWTH
Culture: NO GROWTH

## 2014-12-16 ENCOUNTER — Other Ambulatory Visit (HOSPITAL_COMMUNITY): Payer: Self-pay | Admitting: Family Medicine

## 2014-12-16 ENCOUNTER — Ambulatory Visit (HOSPITAL_COMMUNITY)
Admission: RE | Admit: 2014-12-16 | Discharge: 2014-12-16 | Disposition: A | Discharge status: Home / Self Care | Payer: 59 | Source: Ambulatory Visit | Attending: Family Medicine | Admitting: Family Medicine

## 2014-12-16 DIAGNOSIS — R05 Cough: Secondary | ICD-10-CM

## 2014-12-16 DIAGNOSIS — R918 Other nonspecific abnormal finding of lung field: Secondary | ICD-10-CM | POA: Diagnosis not present

## 2014-12-16 DIAGNOSIS — R059 Cough, unspecified: Secondary | ICD-10-CM

## 2014-12-16 DIAGNOSIS — R079 Chest pain, unspecified: Secondary | ICD-10-CM | POA: Insufficient documentation

## 2015-02-22 DIAGNOSIS — N979 Female infertility, unspecified: Secondary | ICD-10-CM | POA: Diagnosis not present

## 2015-02-27 DIAGNOSIS — Z87898 Personal history of other specified conditions: Secondary | ICD-10-CM | POA: Diagnosis not present

## 2015-03-06 DIAGNOSIS — N979 Female infertility, unspecified: Secondary | ICD-10-CM | POA: Diagnosis not present

## 2015-03-07 MED FILL — METHYLPREDNISOLONE 16 MG TA: 16 | 6 days supply | Qty: 6 | Fill #0

## 2015-03-07 MED FILL — DOXYCYCLINE HYCLATE 100 MG: 100 | 6 days supply | Qty: 12 | Fill #1

## 2015-03-07 MED FILL — PROGESTERONE OIL 50 MG/ML V: 50 | 20 days supply | Qty: 20 | Fill #1

## 2015-03-07 MED FILL — ESTRADIOL 0.1 MG PATCH: 0.1 | 28 days supply | Qty: 56 | Fill #2

## 2015-03-07 MED FILL — diazePAM 10 MG TABS: 10 | 1 days supply | Qty: 1 | Fill #0

## 2015-03-13 DIAGNOSIS — N979 Female infertility, unspecified: Secondary | ICD-10-CM | POA: Diagnosis not present

## 2015-03-22 DIAGNOSIS — Z8742 Personal history of other diseases of the female genital tract: Secondary | ICD-10-CM | POA: Diagnosis not present

## 2015-03-22 DIAGNOSIS — R0989 Other specified symptoms and signs involving the circulatory and respiratory systems: Secondary | ICD-10-CM | POA: Diagnosis not present

## 2015-03-24 DIAGNOSIS — N979 Female infertility, unspecified: Secondary | ICD-10-CM | POA: Diagnosis not present

## 2015-03-27 DIAGNOSIS — Z136 Encounter for screening for cardiovascular disorders: Secondary | ICD-10-CM | POA: Diagnosis not present

## 2015-03-27 DIAGNOSIS — N979 Female infertility, unspecified: Secondary | ICD-10-CM | POA: Diagnosis not present

## 2015-03-29 DIAGNOSIS — R0989 Other specified symptoms and signs involving the circulatory and respiratory systems: Secondary | ICD-10-CM | POA: Diagnosis not present

## 2015-03-31 DIAGNOSIS — N979 Female infertility, unspecified: Secondary | ICD-10-CM | POA: Diagnosis not present

## 2015-04-06 DIAGNOSIS — N979 Female infertility, unspecified: Secondary | ICD-10-CM | POA: Diagnosis not present

## 2015-04-12 DIAGNOSIS — N979 Female infertility, unspecified: Secondary | ICD-10-CM | POA: Diagnosis not present

## 2015-04-14 MED FILL — PROGESTERONE OIL 50 MG/ML V: 50 | 20 days supply | Qty: 20 | Fill #2

## 2015-04-18 DIAGNOSIS — N979 Female infertility, unspecified: Secondary | ICD-10-CM | POA: Diagnosis not present

## 2015-04-19 MED FILL — ESTRADIOL 0.1 MG PATCH: 0.1 | 28 days supply | Qty: 56 | Fill #3

## 2015-04-21 DIAGNOSIS — Z3201 Encounter for pregnancy test, result positive: Secondary | ICD-10-CM | POA: Diagnosis not present

## 2015-04-24 DIAGNOSIS — Z3201 Encounter for pregnancy test, result positive: Secondary | ICD-10-CM | POA: Diagnosis not present

## 2015-04-27 DIAGNOSIS — Z3201 Encounter for pregnancy test, result positive: Secondary | ICD-10-CM | POA: Diagnosis not present

## 2015-05-01 DIAGNOSIS — Z3201 Encounter for pregnancy test, result positive: Secondary | ICD-10-CM | POA: Diagnosis not present

## 2015-05-02 DIAGNOSIS — N911 Secondary amenorrhea: Secondary | ICD-10-CM | POA: Diagnosis not present

## 2015-05-03 MED FILL — PROGESTERONE OIL 50 MG/ML V: 50 | 20 days supply | Qty: 20 | Fill #3

## 2015-05-09 DIAGNOSIS — N911 Secondary amenorrhea: Secondary | ICD-10-CM | POA: Diagnosis not present

## 2015-05-10 DIAGNOSIS — N979 Female infertility, unspecified: Secondary | ICD-10-CM | POA: Diagnosis not present

## 2015-05-15 MED FILL — ESTRADIOL 0.1 MG PATCH: 0.1 | 28 days supply | Qty: 56 | Fill #4

## 2015-05-18 MED FILL — PROGESTERONE OIL 50 MG/ML V: 50 | 20 days supply | Qty: 20 | Fill #4

## 2015-05-19 DIAGNOSIS — N911 Secondary amenorrhea: Secondary | ICD-10-CM | POA: Diagnosis not present

## 2015-05-26 DIAGNOSIS — N911 Secondary amenorrhea: Secondary | ICD-10-CM | POA: Diagnosis not present

## 2015-06-01 DIAGNOSIS — Z36 Encounter for antenatal screening of mother: Secondary | ICD-10-CM | POA: Diagnosis not present

## 2015-06-01 DIAGNOSIS — Z3401 Encounter for supervision of normal first pregnancy, first trimester: Secondary | ICD-10-CM | POA: Diagnosis not present

## 2015-06-01 DIAGNOSIS — Z3A09 9 weeks gestation of pregnancy: Secondary | ICD-10-CM | POA: Diagnosis not present

## 2015-06-01 DIAGNOSIS — Z3400 Encounter for supervision of normal first pregnancy, unspecified trimester: Secondary | ICD-10-CM | POA: Diagnosis not present

## 2015-06-01 LAB — OB RESULTS CONSOLE RUBELLA ANTIBODY, IGM: RUBELLA: IMMUNE

## 2015-06-01 LAB — OB RESULTS CONSOLE HEPATITIS B SURFACE ANTIGEN: HEP B S AG: NEGATIVE

## 2015-06-01 LAB — OB RESULTS CONSOLE HIV ANTIBODY (ROUTINE TESTING): HIV: NONREACTIVE

## 2015-06-01 LAB — OB RESULTS CONSOLE RPR: RPR: NONREACTIVE

## 2015-06-06 NOTE — Patient Instructions (Signed)
Your procedure is scheduled on:  Monday, Jun 19, 2015  [facility] at:  12NOON  Pick up the phone at the desk and dial [phone].  Call this number if you have problems the morning of surgery: [phone].  Remember: Do NOT eat food:  After Midnight Sunday, June 18, 2015  Do NOT drink clear liquids after:  9:30 AM day of surgery  Take these medicines the morning of surgery with a SIP OF WATER: None  Do NOT wear jewelry (body piercing), metal hair clips/bobby pins, make-up, or nail polish. Do NOT wear lotions, powders, or perfumes.  You may wear deodorant. Do NOT shave for 48 hours prior to surgery. Do NOT bring valuables to the hospital. Contacts, dentures, or bridgework may not be worn into surgery.  Have a responsible adult drive you home and stay with you for 24 hours after your procedure

## 2015-06-07 ENCOUNTER — Encounter (HOSPITAL_COMMUNITY): Payer: Self-pay

## 2015-06-07 ENCOUNTER — Encounter (HOSPITAL_COMMUNITY)
Admission: RE | Admit: 2015-06-07 | Discharge: 2015-06-07 | Disposition: A | Discharge status: Home / Self Care | Payer: 59 | Source: Ambulatory Visit | Attending: Obstetrics and Gynecology | Admitting: Obstetrics and Gynecology

## 2015-06-07 DIAGNOSIS — Z3A11 11 weeks gestation of pregnancy: Secondary | ICD-10-CM | POA: Insufficient documentation

## 2015-06-07 DIAGNOSIS — Z01812 Encounter for preprocedural laboratory examination: Secondary | ICD-10-CM | POA: Diagnosis not present

## 2015-06-07 DIAGNOSIS — O3431 Maternal care for cervical incompetence, first trimester: Secondary | ICD-10-CM | POA: Insufficient documentation

## 2015-06-07 LAB — CBC
HEMATOCRIT: 37.2 % (ref 36.0–46.0)
Hemoglobin: 12.7 g/dL (ref 12.0–15.0)
MCH: 28.5 pg (ref 26.0–34.0)
MCHC: 34.1 g/dL (ref 30.0–36.0)
MCV: 83.6 fL (ref 78.0–100.0)
PLATELETS: 301 10*3/uL (ref 150–400)
RBC: 4.45 MIL/uL (ref 3.87–5.11)
RDW: 12.3 % (ref 11.5–15.5)
WBC: 4.8 10*3/uL (ref 4.0–10.5)

## 2015-06-08 LAB — OB RESULTS CONSOLE GC/CHLAMYDIA
Chlamydia: NEGATIVE
GC PROBE AMP, GENITAL: NEGATIVE

## 2015-06-09 DIAGNOSIS — N979 Female infertility, unspecified: Secondary | ICD-10-CM | POA: Diagnosis not present

## 2015-06-13 DIAGNOSIS — Z36 Encounter for antenatal screening of mother: Secondary | ICD-10-CM | POA: Diagnosis not present

## 2015-06-13 DIAGNOSIS — Z113 Encounter for screening for infections with a predominantly sexual mode of transmission: Secondary | ICD-10-CM | POA: Diagnosis not present

## 2015-06-13 DIAGNOSIS — Z3491 Encounter for supervision of normal pregnancy, unspecified, first trimester: Secondary | ICD-10-CM | POA: Diagnosis not present

## 2015-06-14 DIAGNOSIS — H5213 Myopia, bilateral: Secondary | ICD-10-CM | POA: Diagnosis not present

## 2015-06-14 DIAGNOSIS — H52223 Regular astigmatism, bilateral: Secondary | ICD-10-CM | POA: Diagnosis not present

## 2015-06-19 ENCOUNTER — Ambulatory Visit (HOSPITAL_COMMUNITY)
Admission: RE | Admit: 2015-06-19 | Discharge: 2015-06-19 | Disposition: A | Discharge status: Home / Self Care | Payer: 59 | Source: Ambulatory Visit | Attending: Obstetrics and Gynecology | Admitting: Obstetrics and Gynecology

## 2015-06-19 ENCOUNTER — Encounter (HOSPITAL_COMMUNITY)
Admission: RE | Disposition: A | Discharge status: Home / Self Care | Payer: Self-pay | Source: Ambulatory Visit | Attending: Obstetrics and Gynecology

## 2015-06-19 ENCOUNTER — Ambulatory Visit (HOSPITAL_COMMUNITY): Payer: 59 | Admitting: Anesthesiology

## 2015-06-19 ENCOUNTER — Encounter (HOSPITAL_COMMUNITY): Payer: Self-pay | Admitting: Anesthesiology

## 2015-06-19 DIAGNOSIS — Z3A11 11 weeks gestation of pregnancy: Secondary | ICD-10-CM | POA: Diagnosis not present

## 2015-06-19 DIAGNOSIS — O3431 Maternal care for cervical incompetence, first trimester: Secondary | ICD-10-CM | POA: Diagnosis not present

## 2015-06-19 HISTORY — PX: CERVICAL CERCLAGE: SHX1329

## 2015-06-19 SURGERY — CERCLAGE, CERVIX, VAGINAL APPROACH
Anesthesia: Spinal | Site: Vagina

## 2015-06-19 MED ORDER — LACTATED RINGERS IV SOLN: INTRAVENOUS | Status: DC

## 2015-06-19 MED ORDER — BUPIVACAINE IN DEXTROSE 0.75-8.25 % IT SOLN
INTRATHECAL | Status: DC | PRN
  Administered 2015-06-19: 1.2 mL via INTRATHECAL

## 2015-06-19 MED ORDER — FENTANYL CITRATE (PF) 100 MCG/2ML IJ SOLN: 25.0000 ug | INTRAMUSCULAR | Status: DC | PRN

## 2015-06-19 MED ORDER — CEFAZOLIN SODIUM-DEXTROSE 2-4 GM/100ML-% IV SOLN
2.0000 g | INTRAVENOUS | Status: AC
  Administered 2015-06-19: 2 g via INTRAVENOUS
  Filled 2015-06-19: qty 100

## 2015-06-19 MED ORDER — LACTATED RINGERS IV SOLN
INTRAVENOUS | Status: DC
  Administered 2015-06-19 (×2): via INTRAVENOUS

## 2015-06-19 MED ORDER — CEFAZOLIN SODIUM-DEXTROSE 2-3 GM-% IV SOLR
INTRAVENOUS | Status: AC
  Filled 2015-06-19: qty 50

## 2015-06-19 SURGICAL SUPPLY — 17 items
CLOTH BEACON ORANGE TIMEOUT ST (SAFETY) ×3 IMPLANT
COUNTER NEEDLE 1200 MAGNETIC (NEEDLE) ×2 IMPLANT
GLOVE BIO SURGEON STRL SZ 6.5 (GLOVE) ×2 IMPLANT
GLOVE BIO SURGEONS STRL SZ 6.5 (GLOVE) ×1
GLOVE BIOGEL PI IND STRL 7.0 (GLOVE) ×1 IMPLANT
GLOVE BIOGEL PI INDICATOR 7.0 (GLOVE) ×2
GOWN STRL REUS W/TWL LRG LVL3 (GOWN DISPOSABLE) ×6 IMPLANT
PACK VAGINAL MINOR WOMEN LF (CUSTOM PROCEDURE TRAY) ×3 IMPLANT
PAD OB MATERNITY 4.3X12.25 (PERSONAL CARE ITEMS) ×3 IMPLANT
PAD PREP 24X48 CUFFED NSTRL (MISCELLANEOUS) ×3 IMPLANT
SUT MERSILENE 5MM BP 1 12 (SUTURE) ×4 IMPLANT
TOWEL OR 17X24 6PK STRL BLUE (TOWEL DISPOSABLE) ×6 IMPLANT
TRAY FOLEY CATH SILVER 14FR (SET/KITS/TRAYS/PACK) ×3 IMPLANT
TUBING NON-CON 1/4 X 20 CONN (TUBING) IMPLANT
TUBING NON-CON 1/4 X 20' CONN (TUBING)
WATER STERILE IRR 1000ML POUR (IV SOLUTION) ×3 IMPLANT
YANKAUER SUCT BULB TIP NO VENT (SUCTIONS) IMPLANT

## 2015-06-19 NOTE — Anesthesia Preprocedure Evaluation (Signed)
Anesthesia Evaluation  Patient identified by MRN, date of birth, ID band Patient awake    Reviewed: Allergy & Precautions, NPO status , Patient's Chart, lab work & pertinent test results  History of Anesthesia Complications (+) PONV and history of anesthetic complications  Airway Mallampati: II  TM Distance: >3 FB Neck ROM: Full    Dental no notable dental hx. (+) Teeth Intact   Pulmonary neg pulmonary ROS,    Pulmonary exam normal breath sounds clear to auscultation       Cardiovascular negative cardio ROS Normal cardiovascular exam Rhythm:Regular Rate:Normal     Neuro/Psych negative neurological ROS  negative psych ROS   GI/Hepatic negative GI ROS, Neg liver ROS,   Endo/Other  Obesity  Renal/GU negative Renal ROS  negative genitourinary   Musculoskeletal negative musculoskeletal ROS (+)   Abdominal (+) + obese,   Peds  Hematology  (+) anemia ,   Anesthesia Other Findings   Reproductive/Obstetrics (+) Pregnancy 13 weeks  Hx/o multiple fetal losses Incompetent cervix Hx/o Previous C/Section                              Anesthesia Physical Anesthesia Plan  ASA: II  Anesthesia Plan: Spinal   Post-op Pain Management:    Induction:   Airway Management Planned: Natural Airway  Additional Equipment:   Intra-op Plan:   Post-operative Plan:   Informed Consent: I have reviewed the patients History and Physical, chart, labs and discussed the procedure including the risks, benefits and alternatives for the proposed anesthesia with the patient or authorized representative who has indicated his/her understanding and acceptance.     Plan Discussed with: Anesthesiologist, CRNA and Surgeon  Anesthesia Plan Comments:         Anesthesia Quick Evaluation

## 2015-06-19 NOTE — Transfer of Care (Signed)
Immediate Anesthesia Transfer of Care Note  Patient: Andrea MaroonLachina M Veras  Procedure(s) Performed: Procedure(s): CERCLAGE CERVICAL (N/A)  Patient Location: PACU  Anesthesia Type:Spinal  Level of Consciousness: awake, alert  and oriented  Airway & Oxygen Therapy: Patient Spontanous Breathing and Patient connected to nasal cannula oxygen  Post-op Assessment: Report given to RN and Post -op Vital signs reviewed and stable  Post vital signs: Reviewed and stable  Last Vitals:  Filed Vitals:   06/19/15 1203 06/19/15 1408  BP: 110/72 102/63  Pulse: 65 61  Temp: 36.9 C 36.6 C  Resp: 20 19    Last Pain: There were no vitals filed for this visit.       Complications: No apparent anesthesia complications

## 2015-06-19 NOTE — Anesthesia Procedure Notes (Signed)
Spinal Patient location during procedure: OR Start time: 06/19/2015 1:42 PM Staffing Anesthesiologist: Mal AmabileFOSTER, Janaa Acero Performed by: anesthesiologist  Preanesthetic Checklist Completed: patient identified, site marked, surgical consent, pre-op evaluation, timeout performed, IV checked, risks and benefits discussed and monitors and equipment checked Spinal Block Patient position: sitting Prep: site prepped and draped and DuraPrep Patient monitoring: heart rate, cardiac monitor, continuous pulse ox and blood pressure Approach: midline Location: L3-4 Injection technique: single-shot Needle Needle type: Pencan  Needle gauge: 24 G Needle length: 9 cm Needle insertion depth: 6 cm Assessment Sensory level: T8 Additional Notes Patient tolerated procedure well. Adequate sensory level.

## 2015-06-19 NOTE — Brief Op Note (Signed)
06/19/2015  2:03 PM  PATIENT:  Massie MaroonLachina M Santo  40 y.o. female  PRE-OPERATIVE DIAGNOSIS:   IUP at 11 weeks Incompetent cervix  POST-OPERATIVE DIAGNOSIS:   Same  PROCEDURE:  Procedure(s): CERCLAGE CERVICAL (N/A)  SURGEON:  Surgeon(s) and Role:    * Marcelle OverlieMichelle Gibson Lad, MD - Primary  PHYSICIAN ASSISTANT:   ASSISTANTS: none   ANESTHESIA:   spinal  EBL:     BLOOD ADMINISTERED:none  DRAINS: none   LOCAL MEDICATIONS USED:  NONE  SPECIMEN:  No Specimen  DISPOSITION OF SPECIMEN:  N/A  COUNTS:  YES  TOURNIQUET:  * No tourniquets in log *  DICTATION: .Other Dictation: Dictation Number [id number]  PLAN OF CARE: Discharge to home after PACU  PATIENT DISPOSITION:  PACU - hemodynamically stable.   Delay start of Pharmacological VTE agent (>24hrs) due to surgical blood loss or risk of bleeding: not applicable

## 2015-06-19 NOTE — Discharge Instructions (Signed)
°  Post Anesthesia Home Care Instructions  Activity: Get plenty of rest for the remainder of the day. A responsible adult should stay with you for 24 hours following the procedure.  For the next 24 hours, DO NOT: -Drive a car -Advertising copywriterperate machinery -Drink alcoholic beverages -Take any medication unless instructed by your physician -Make any legal decisions or sign important papers.  Meals: Start with liquid foods such as gelatin or soup. Progress to regular foods as tolerated. Avoid greasy, spicy, heavy foods. If nausea and/or vomiting occur, drink only clear liquids until the nausea and/or vomiting subsides. Call your physician if vomiting continues.  Special Instructions/Symptoms: Your throat may feel dry or sore from the anesthesia or the breathing tube placed in your throat during surgery. If this causes discomfort, gargle with warm salt water. The discomfort should disappear within 24 hours.   Cervical Cerclage, Care After Refer to this sheet in the next few weeks. These instructions provide you with information on caring for yourself after your procedure. Your health care provider may also give you more specific instructions. Your treatment has been planned according to current medical practices, but problems sometimes occur. Call your health care provider if you have any problems or questions after your procedure. WHAT TO EXPECT AFTER THE PROCEDURE  After your procedure, it is typical to have the following:  Abdominal cramping.  Vaginal spotting. HOME CARE INSTRUCTIONS   Only take over-the-counter or prescription medicines for pain, discomfort, or fever as directed by your health care provider.  Avoid physical activities and exercise until your health care provider says it is okay.  Do not douche or have sexual intercourse until your health care provider tells you it is okay.  Keep your follow-up surgical and prenatal appointments with your health care provider. SEEK MEDICAL CARE  IF:   You have abnormal vaginal discharge.  You have a rash.  You become lightheaded or feel faint.  You have abdominal pain that is not controlled with pain medicine. SEEK IMMEDIATE MEDICAL CARE IF:   You develop vaginal bleeding.  You are leaking fluid or have a gush of fluid from the vagina.  You have a fever.  You faint.  You have uterine contractions.  You feel your baby is not moving as much as usual, or you cannot feel your baby move.  You have chest pain or shortness of breath.   This information is not intended to replace advice given to you by your health care provider. Make sure you discuss any questions you have with your health care provider.   Document Released: 11/25/2012 Document Revised: 02/09/2013 Document Reviewed: 11/25/2012 Elsevier Interactive Patient Education [employer]2016 Elsevier Inc.

## 2015-06-19 NOTE — H&P (Signed)
40 year old female at 611 + weeks gestation with known incompetent cervix here for prophylactic cerclage.  Past Medical History  Diagnosis Date  . Complication of anesthesia   . Anemia     history  . PCOS (polycystic ovarian syndrome)     treatment with metformin  . PONV (postoperative nausea and vomiting)    Past Surgical History  Procedure Laterality Date  . Bilateral foot surgery    . Hand surgery      as a child -tendons & ligaments repair  . Wisdom tooth extraction    . Dilation and curettage of uterus      x 2 for polyp removal  . Dilatation & currettage/hysteroscopy with resectocope  02/03/2012    Procedure: DILATATION & CURETTAGE/HYSTEROSCOPY WITH RESECTOCOPE;  Surgeon: Leslie AndreaJames E Tomblin II, MD;  Location: [facility];  Service: Gynecology;  Laterality: N/A;  no resection done  . Cesarean section N/A 09/26/2014    Procedure: CESAREAN SECTION;  Surgeon: Marcelle OverlieMichelle Tommaso Cavitt, MD;  Location: [facility];  Service: Obstetrics;  Laterality: N/A;  . Polypectomy     NKDA  Prior to Admission medications   Medication Sig Start Date End Date Taking? Authorizing Provider  Cholecalciferol (VITAMIN D) 2000 UNITS CAPS Take 1 capsule by mouth daily.   Yes Historical Provider, MD  docusate sodium (COLACE) 100 MG capsule Take 100 mg by mouth daily.    Yes Historical Provider, MD  loratadine (CLARITIN) 10 MG tablet Take 10 mg by mouth daily as needed for allergies.   Yes Historical Provider, MD  Prenatal Vit-Fe Fumarate-FA (PRENATAL MULTIVITAMIN) TABS tablet Take 1 tablet by mouth daily at 12 noon.   Yes Historical Provider, MD   BP 110/72 mmHg  Pulse 65  Temp(Src) 98.5 F (36.9 C) (Oral)  Resp 20  SpO2 100%  LMP 03/03/2015 (Approximate) Abdomen is soft and non tender Ultrasound - singleton fetus with normal heart rate  IMPRESSION: IUP at 11 + weeks Incompetent cervix  PLAN: McDonald Cerclage Risks reviewed Consent signed

## 2015-06-19 NOTE — Anesthesia Postprocedure Evaluation (Signed)
Anesthesia Post Note  Patient: Andrea Young  Procedure(s) Performed: Procedure(s) (LRB): CERCLAGE CERVICAL (N/A)  Patient location during evaluation: PACU Anesthesia Type: Spinal Level of consciousness: oriented and awake and alert Pain management: pain level controlled Vital Signs Assessment: post-procedure vital signs reviewed and stable Respiratory status: spontaneous breathing, respiratory function stable and patient connected to nasal cannula oxygen Cardiovascular status: blood pressure returned to baseline and stable Postop Assessment: no headache, no backache, spinal receding, patient able to bend at knees, no signs of nausea or vomiting and adequate PO intake Anesthetic complications: no     Last Vitals:  Filed Vitals:   06/19/15 1645 06/19/15 1700  BP: 105/62 106/64  Pulse: 62 58  Temp: 36.7 C   Resp: 18 18    Last Pain:  Filed Vitals:   06/19/15 1706  PainSc: 0-No pain   Pain Goal:                 Cecile HearingStephen Edward Tascha Casares

## 2015-06-20 ENCOUNTER — Encounter (HOSPITAL_COMMUNITY): Payer: Self-pay | Admitting: Obstetrics and Gynecology

## 2015-06-20 NOTE — Op Note (Signed)
NAMJulianne Handler:  Bowden, Oneal              ACCOUNT NO.:  [id number]  MEDICAL RECORD NO.:  [id number]  LOCATION:  [facility]                          FACILITY:  [facility]  PHYSICIAN:  Saher Davee L. Carmela Piechowski, M.D.DATE OF BIRTH:  [birth year]  DATE OF PROCEDURE:  06/19/2015 DATE OF DISCHARGE:  06/19/2015                              OPERATIVE REPORT   PREOPERATIVE DIAGNOSIS:  Intrauterine pregnancy at 11 weeks and incompetent cervix.  POSTOPERATIVE DIAGNOSIS:  Intrauterine pregnancy at 11 weeks and incompetent cervix.  PROCEDURE:  McDonald cervical cerclage.  SURGEON:  Marlon Vonruden L. Nyesha Cliff, MD  ANESTHESIA:  Spinal.  EBL:  Minimal.  COMPLICATIONS:  None.  PROCEDURE IN DETAIL:  Patient was taken to the operating room.  Her spinal was placed.  She was prepped and draped in usual sterile fashion. Foley catheter was inserted after appropriate time-out was performed. The speculum was then inserted into the vagina.  The cervix was identified, appeared to be normal and it was closed.  Cerclage forceps were used to grasp the anterior lip using Mersilene suture.  I then placed 1 stitch from 12 to 3 o'clock to set 2nd stitch from 3 to 6 o'clock, the 3rd stitch from 12 to 9 o'clock, and the final stitch from 9 to 6 o'clock.  The knot was tied in the 6 o'clock position.  There was minimal bleeding noted.  I felt like the cerclage was high up near the junction of the internal os and the cervix had good length.  The cervix was observed for period of time.  There was no bleeding noted.  All instruments were removed from the vagina.  The patient will go to recovery room and will be discharged home from there.     Myanna Ziesmer L. Vincente PoliGrewal, M.D.     Florestine AversMLG/MEDQ  D:  06/19/2015  T:  06/20/2015  Job:  [id number]

## 2015-06-21 DIAGNOSIS — Z3A12 12 weeks gestation of pregnancy: Secondary | ICD-10-CM | POA: Diagnosis not present

## 2015-06-21 DIAGNOSIS — Z36 Encounter for antenatal screening of mother: Secondary | ICD-10-CM | POA: Diagnosis not present

## 2015-06-21 DIAGNOSIS — O09521 Supervision of elderly multigravida, first trimester: Secondary | ICD-10-CM | POA: Diagnosis not present

## 2015-07-03 DIAGNOSIS — Z3400 Encounter for supervision of normal first pregnancy, unspecified trimester: Secondary | ICD-10-CM | POA: Diagnosis not present

## 2015-07-03 DIAGNOSIS — O26852 Spotting complicating pregnancy, second trimester: Secondary | ICD-10-CM | POA: Diagnosis not present

## 2015-07-03 DIAGNOSIS — Z3A14 14 weeks gestation of pregnancy: Secondary | ICD-10-CM | POA: Diagnosis not present

## 2015-07-03 DIAGNOSIS — O3433 Maternal care for cervical incompetence, third trimester: Secondary | ICD-10-CM | POA: Diagnosis not present

## 2015-07-05 ENCOUNTER — Inpatient Hospital Stay (HOSPITAL_COMMUNITY): Payer: 59

## 2015-07-05 ENCOUNTER — Encounter (HOSPITAL_COMMUNITY): Payer: Self-pay

## 2015-07-05 ENCOUNTER — Inpatient Hospital Stay (HOSPITAL_COMMUNITY)
Admission: AD | Admit: 2015-07-05 | Discharge: 2015-07-05 | Disposition: A | Discharge status: Home / Self Care | Payer: 59 | Source: Ambulatory Visit | Attending: Obstetrics and Gynecology | Admitting: Obstetrics and Gynecology

## 2015-07-05 DIAGNOSIS — N939 Abnormal uterine and vaginal bleeding, unspecified: Secondary | ICD-10-CM | POA: Diagnosis present

## 2015-07-05 DIAGNOSIS — E282 Polycystic ovarian syndrome: Secondary | ICD-10-CM | POA: Diagnosis not present

## 2015-07-05 DIAGNOSIS — O4692 Antepartum hemorrhage, unspecified, second trimester: Secondary | ICD-10-CM | POA: Diagnosis not present

## 2015-07-05 DIAGNOSIS — O3432 Maternal care for cervical incompetence, second trimester: Secondary | ICD-10-CM | POA: Diagnosis not present

## 2015-07-05 DIAGNOSIS — N883 Incompetence of cervix uteri: Secondary | ICD-10-CM | POA: Diagnosis not present

## 2015-07-05 DIAGNOSIS — Z3A17 17 weeks gestation of pregnancy: Secondary | ICD-10-CM | POA: Insufficient documentation

## 2015-07-05 DIAGNOSIS — O209 Hemorrhage in early pregnancy, unspecified: Secondary | ICD-10-CM | POA: Diagnosis not present

## 2015-07-05 LAB — CBC
HCT: 35.6 % — ABNORMAL LOW (ref 36.0–46.0)
Hemoglobin: 12.3 g/dL (ref 12.0–15.0)
MCH: 28.3 pg (ref 26.0–34.0)
MCHC: 34.6 g/dL (ref 30.0–36.0)
MCV: 82 fL (ref 78.0–100.0)
PLATELETS: 274 10*3/uL (ref 150–400)
RBC: 4.34 MIL/uL (ref 3.87–5.11)
RDW: 12.5 % (ref 11.5–15.5)
WBC: 4.9 10*3/uL (ref 4.0–10.5)

## 2015-07-05 LAB — URINALYSIS, ROUTINE W REFLEX MICROSCOPIC
Bilirubin Urine: NEGATIVE
GLUCOSE, UA: NEGATIVE mg/dL
KETONES UR: NEGATIVE mg/dL
LEUKOCYTES UA: NEGATIVE
Nitrite: NEGATIVE
Protein, ur: NEGATIVE mg/dL
pH: 6 (ref 5.0–8.0)

## 2015-07-05 LAB — WET PREP, GENITAL
Clue Cells Wet Prep HPF POC: NONE SEEN
Sperm: NONE SEEN
Trich, Wet Prep: NONE SEEN
WBC WET PREP: NONE SEEN
YEAST WET PREP: NONE SEEN

## 2015-07-05 LAB — URINE MICROSCOPIC-ADD ON: WBC UA: NONE SEEN WBC/hpf (ref 0–5)

## 2015-07-05 NOTE — MAU Note (Signed)
Pt presents complaining of bright red vaginal bleeding that started an hour ago. States it was a moderate amount of bleeding at home but here it is less. Denies pain but is having pressure. Cerclage placed at [facility]

## 2015-07-05 NOTE — Discharge Instructions (Signed)
Pelvic Rest Pelvic rest is sometimes recommended for women when:   The placenta is partially or completely covering the opening of the cervix (placenta previa).  There is bleeding between the uterine wall and the amniotic sac in the first trimester (subchorionic hemorrhage).  The cervix begins to open without labor starting (incompetent cervix, cervical insufficiency).  The labor is too early (preterm labor). HOME CARE INSTRUCTIONS  Do not have sexual intercourse, stimulation, or an orgasm.  Do not use tampons, douche, or put anything in the vagina.  Do not lift anything over 10 pounds (4.5 kg).  Avoid strenuous activity or straining your pelvic muscles. SEEK MEDICAL CARE IF:  You have any vaginal bleeding during pregnancy. Treat this as a potential emergency.  You have cramping pain felt low in the stomach (stronger than menstrual cramps).  You notice vaginal discharge (watery, mucus, or bloody).  You have a low, dull backache.  There are regular contractions or uterine tightening. SEEK IMMEDIATE MEDICAL CARE IF: You have vaginal bleeding and have placenta previa.    This information is not intended to replace advice given to you by your health care provider. Make sure you discuss any questions you have with your health care provider.   Document Released: 06/01/2010 Document Revised: 04/29/2011 Document Reviewed: 08/08/2014 Elsevier Interactive Patient Education 2016 Elsevier Inc.  Vaginal Bleeding During Pregnancy, Second Trimester A small amount of bleeding (spotting) from the vagina is relatively common in pregnancy. It usually stops on its own. Various things can cause bleeding or spotting in pregnancy. Some bleeding may be related to the pregnancy, and some may not. Sometimes the bleeding is normal and is not a problem. However, bleeding can also be a sign of something serious. Be sure to tell your health care provider about any vaginal bleeding right away. Some  possible causes of vaginal bleeding during the second trimester include:  Infection, inflammation, or growths on the cervix.   The placenta may be partially or completely covering the opening of the cervix inside the uterus (placenta previa).  The placenta may have separated from the uterus (abruption of the placenta).   You may be having early (preterm) labor.   The cervix may not be strong enough to keep a baby inside the uterus (cervical insufficiency).   Tiny cysts may have developed in the uterus instead of pregnancy tissue (molar pregnancy). HOME CARE INSTRUCTIONS  Watch your condition for any changes. The following actions may help to lessen any discomfort you are feeling:  Follow your health care provider's instructions for limiting your activity. If your health care provider orders bed rest, you may need to stay in bed and only get up to use the bathroom. However, your health care provider may allow you to continue light activity.  If needed, make plans for someone to help with your regular activities and responsibilities while you are on bed rest.  Keep track of the number of pads you use each day, how often you change pads, and how soaked (saturated) they are. Write this down.  Do not use tampons. Do not douche.  Do not have sexual intercourse or orgasms until approved by your health care provider.  If you pass any tissue from your vagina, save the tissue so you can show it to your health care provider.  Only take over-the-counter or prescription medicines as directed by your health care provider.  Do not take aspirin because it can make you bleed.  Do not exercise or perform any strenuous activities  or heavy lifting without your health care provider's permission.  Keep all follow-up appointments as directed by your health care provider. SEEK MEDICAL CARE IF:  You have any vaginal bleeding during any part of your pregnancy.  You have cramps or labor  pains.  You have a fever, not controlled by medicine. SEEK IMMEDIATE MEDICAL CARE IF:   You have severe cramps in your back or belly (abdomen).  You have contractions.  You have chills.  You pass large clots or tissue from your vagina.  Your bleeding increases.  You feel light-headed or weak, or you have fainting episodes.  You are leaking fluid or have a gush of fluid from your vagina. MAKE SURE YOU:  Understand these instructions.  Will watch your condition.  Will get help right away if you are not doing well or get worse.   This information is not intended to replace advice given to you by your health care provider. Make sure you discuss any questions you have with your health care provider.   Document Released: 11/14/2004 Document Revised: 02/09/2013 Document Reviewed: 10/12/2012 Elsevier Interactive Patient Education [employer]2016 Elsevier Inc.

## 2015-07-05 NOTE — MAU Provider Note (Signed)
History     CSN: [id number]  Arrival date and time: 07/05/15 [id number]   First Provider Initiated Contact with Patient 07/05/15 2019      Chief Complaint  Patient presents with  . Vaginal Bleeding   HPI Comments: Andrea Young is a 40 y.o. G2P0101 at 4727w5d who presents today with vaginal bleeding. She has known cervical incompetence, and had a Mcdonald cerclage placed on 06/19/15. She was seen in the office on 07/03/15 for spotting. She states that she had an US done on that day, and everything was "normal". She denies any pain at this time.   Vaginal Bleeding [name] include vaginal bleeding. This is a new problem. The current episode started today. The problem occurs intermittently. The problem has been unchanged. The patient is experiencing no pain. She is pregnant. Associated symptoms include constipation (takes colace daily. It helps. ). Pertinent negatives include no abdominal pain, chills, diarrhea, dysuria, fever, frequency, nausea, urgency or vomiting. The vaginal discharge was bloody. The vaginal bleeding is lighter than menses (bright red ). She has not been passing clots. She has not been passing tissue. Nothing aggravates the symptoms. She has tried nothing for the symptoms. Sexual activity: No recent intercourse.  Menstrual history: LMP 03/03/15     Past Medical History  Diagnosis Date  . Complication of anesthesia   . Anemia     history  . PCOS (polycystic ovarian syndrome)     treatment with metformin  . PONV (postoperative nausea and vomiting)     Past Surgical History  Procedure Laterality Date  . Bilateral foot surgery    . Hand surgery      as a child -tendons & ligaments repair  . Wisdom tooth extraction    . Dilation and curettage of uterus      x 2 for polyp removal  . Dilatation & currettage/hysteroscopy with resectocope  02/03/2012    Procedure: DILATATION & CURETTAGE/HYSTEROSCOPY WITH RESECTOCOPE;  Surgeon: Leslie AndreaJames E Tomblin II, MD;   Location: [facility];  Service: Gynecology;  Laterality: N/A;  no resection done  . Cesarean section N/A 09/26/2014    Procedure: CESAREAN SECTION;  Surgeon: Marcelle OverlieMichelle Grewal, MD;  Location: [facility];  Service: Obstetrics;  Laterality: N/A;  . Polypectomy    . Cervical cerclage N/A 06/19/2015    Procedure: CERCLAGE CERVICAL;  Surgeon: Marcelle OverlieMichelle Grewal, MD;  Location: [facility];  Service: Gynecology;  Laterality: N/A;    Family History  Problem Relation Age of Onset  . Diabetes Mother   . Hypertension Father   . Hyperlipidemia Father     Social History  Substance Use Topics  . Smoking status: Never Smoker   . Smokeless tobacco: Never Used  . Alcohol Use: No    Allergies: No Known Allergies  Prescriptions prior to admission  Medication Sig Dispense Refill Last Dose  . acetaminophen (TYLENOL) 325 MG tablet Take 325 mg by mouth every 6 (six) hours as needed for headache.   07/04/2015 at Unknown time  . Cholecalciferol (VITAMIN D) 2000 UNITS CAPS Take 1 capsule by mouth daily.   07/05/2015 at Unknown time  . docusate sodium (COLACE) 100 MG capsule Take 100 mg by mouth daily.    07/05/2015 at Unknown time  . Prenatal Vit-Fe Fumarate-FA (PRENATAL MULTIVITAMIN) TABS tablet Take 1 tablet by mouth daily at 12 noon.   07/05/2015 at Unknown time    Review of Systems  Constitutional: Negative for fever and chills.  Gastrointestinal: Positive for constipation (takes colace  daily. It helps. ). Negative for nausea, vomiting, abdominal pain and diarrhea.  Genitourinary: Positive for vaginal bleeding. Negative for dysuria, urgency and frequency.   Physical Exam   Blood pressure 119/71, pulse 59, temperature 98.3 F (36.8 C), temperature source Oral, resp. rate 18, last menstrual period 03/03/2015, unknown if currently breastfeeding.  Physical Exam  MAU Course  Procedures  MDM 2127: D/W Dr. Marcelle Overlie,  [facility] for DC home. FU in the office on Friday for recheck.   Assessment and Plan   1. [redacted] weeks gestation  of pregnancy   2. Cervical incompetence   3. Vaginal bleeding in pregnancy, second trimester    DC home Comfort measures reviewed  2nd Trimester precautions  Bleeding precautions Pelvic rest  RX: none  Return to MAU as needed   Follow-up Information    Follow up with Meriel Pica, MD.   Specialty:  Obstetrics and Gynecology   Why:  Call for an appointment to be seen on Friday    Contact information:   [address] [place] [address] [phone]         Tawnya Crook 07/05/2015, 8:21 PM

## 2015-07-06 LAB — GC/CHLAMYDIA PROBE AMP (~~LOC~~) NOT AT ARMC
CHLAMYDIA, DNA PROBE: NEGATIVE
NEISSERIA GONORRHEA: NEGATIVE

## 2015-07-10 DIAGNOSIS — N979 Female infertility, unspecified: Secondary | ICD-10-CM | POA: Diagnosis not present

## 2015-07-12 DIAGNOSIS — Z3400 Encounter for supervision of normal first pregnancy, unspecified trimester: Secondary | ICD-10-CM | POA: Diagnosis not present

## 2015-07-12 DIAGNOSIS — O3432 Maternal care for cervical incompetence, second trimester: Secondary | ICD-10-CM | POA: Diagnosis not present

## 2015-07-12 DIAGNOSIS — Z3A15 15 weeks gestation of pregnancy: Secondary | ICD-10-CM | POA: Diagnosis not present

## 2015-07-19 DIAGNOSIS — Z36 Encounter for antenatal screening of mother: Secondary | ICD-10-CM | POA: Diagnosis not present

## 2015-07-24 DIAGNOSIS — O3432 Maternal care for cervical incompetence, second trimester: Secondary | ICD-10-CM | POA: Diagnosis not present

## 2015-07-24 DIAGNOSIS — Z3A17 17 weeks gestation of pregnancy: Secondary | ICD-10-CM | POA: Diagnosis not present

## 2015-07-24 DIAGNOSIS — Z3400 Encounter for supervision of normal first pregnancy, unspecified trimester: Secondary | ICD-10-CM | POA: Diagnosis not present

## 2015-08-03 DIAGNOSIS — Z3400 Encounter for supervision of normal first pregnancy, unspecified trimester: Secondary | ICD-10-CM | POA: Diagnosis not present

## 2015-08-03 DIAGNOSIS — Z3A18 18 weeks gestation of pregnancy: Secondary | ICD-10-CM | POA: Diagnosis not present

## 2015-08-09 DIAGNOSIS — Z3492 Encounter for supervision of normal pregnancy, unspecified, second trimester: Secondary | ICD-10-CM | POA: Diagnosis not present

## 2015-08-09 DIAGNOSIS — Z36 Encounter for antenatal screening of mother: Secondary | ICD-10-CM | POA: Diagnosis not present

## 2015-08-10 DIAGNOSIS — N979 Female infertility, unspecified: Secondary | ICD-10-CM | POA: Diagnosis not present

## 2015-08-15 ENCOUNTER — Other Ambulatory Visit (HOSPITAL_COMMUNITY): Payer: Self-pay | Admitting: Obstetrics and Gynecology

## 2015-08-15 DIAGNOSIS — O09522 Supervision of elderly multigravida, second trimester: Secondary | ICD-10-CM

## 2015-08-15 DIAGNOSIS — Q270 Congenital absence and hypoplasia of umbilical artery: Secondary | ICD-10-CM

## 2015-08-15 DIAGNOSIS — O3432 Maternal care for cervical incompetence, second trimester: Secondary | ICD-10-CM

## 2015-08-18 ENCOUNTER — Encounter (HOSPITAL_COMMUNITY): Payer: Self-pay

## 2015-08-18 ENCOUNTER — Ambulatory Visit (HOSPITAL_COMMUNITY)
Admission: RE | Admit: 2015-08-18 | Discharge: 2015-08-18 | Disposition: A | Discharge status: Home / Self Care | Payer: 59 | Source: Ambulatory Visit | Attending: Obstetrics and Gynecology | Admitting: Obstetrics and Gynecology

## 2015-08-18 ENCOUNTER — Other Ambulatory Visit (HOSPITAL_COMMUNITY): Payer: Self-pay | Admitting: Obstetrics and Gynecology

## 2015-08-18 DIAGNOSIS — O09522 Supervision of elderly multigravida, second trimester: Secondary | ICD-10-CM

## 2015-08-18 DIAGNOSIS — O09292 Supervision of pregnancy with other poor reproductive or obstetric history, second trimester: Secondary | ICD-10-CM | POA: Diagnosis not present

## 2015-08-18 DIAGNOSIS — Q270 Congenital absence and hypoplasia of umbilical artery: Secondary | ICD-10-CM

## 2015-08-18 DIAGNOSIS — Z3A21 21 weeks gestation of pregnancy: Secondary | ICD-10-CM | POA: Diagnosis not present

## 2015-08-18 DIAGNOSIS — O3432 Maternal care for cervical incompetence, second trimester: Secondary | ICD-10-CM

## 2015-08-18 DIAGNOSIS — O09219 Supervision of pregnancy with history of pre-term labor, unspecified trimester: Secondary | ICD-10-CM

## 2015-08-21 MED FILL — PROGESTERONE 200 MG CAPSULE: 200 | 30 days supply | Qty: 30 | Fill #0

## 2015-08-23 ENCOUNTER — Encounter (HOSPITAL_COMMUNITY): Payer: Self-pay

## 2015-08-23 ENCOUNTER — Ambulatory Visit (HOSPITAL_COMMUNITY)
Admission: RE | Admit: 2015-08-23 | Discharge: 2015-08-23 | Disposition: A | Discharge status: Home / Self Care | Payer: 59 | Source: Ambulatory Visit | Attending: Obstetrics and Gynecology | Admitting: Obstetrics and Gynecology

## 2015-08-23 ENCOUNTER — Other Ambulatory Visit (HOSPITAL_COMMUNITY): Payer: Self-pay

## 2015-08-23 ENCOUNTER — Other Ambulatory Visit (HOSPITAL_COMMUNITY): Payer: Self-pay | Admitting: Maternal and Fetal Medicine

## 2015-08-23 DIAGNOSIS — Z3A22 22 weeks gestation of pregnancy: Secondary | ICD-10-CM

## 2015-08-23 DIAGNOSIS — O09219 Supervision of pregnancy with history of pre-term labor, unspecified trimester: Secondary | ICD-10-CM

## 2015-08-23 DIAGNOSIS — O3432 Maternal care for cervical incompetence, second trimester: Secondary | ICD-10-CM | POA: Insufficient documentation

## 2015-08-23 DIAGNOSIS — O09292 Supervision of pregnancy with other poor reproductive or obstetric history, second trimester: Secondary | ICD-10-CM | POA: Insufficient documentation

## 2015-08-29 ENCOUNTER — Ambulatory Visit (HOSPITAL_COMMUNITY)
Admission: RE | Admit: 2015-08-29 | Discharge: 2015-08-29 | Disposition: A | Discharge status: Home / Self Care | Payer: 59 | Source: Ambulatory Visit | Attending: Obstetrics and Gynecology | Admitting: Obstetrics and Gynecology

## 2015-08-29 DIAGNOSIS — Z3A23 23 weeks gestation of pregnancy: Secondary | ICD-10-CM | POA: Insufficient documentation

## 2015-08-29 DIAGNOSIS — O09522 Supervision of elderly multigravida, second trimester: Secondary | ICD-10-CM | POA: Insufficient documentation

## 2015-08-29 DIAGNOSIS — O09212 Supervision of pregnancy with history of pre-term labor, second trimester: Secondary | ICD-10-CM | POA: Diagnosis not present

## 2015-08-29 DIAGNOSIS — O09292 Supervision of pregnancy with other poor reproductive or obstetric history, second trimester: Secondary | ICD-10-CM | POA: Diagnosis not present

## 2015-08-29 DIAGNOSIS — O3432 Maternal care for cervical incompetence, second trimester: Secondary | ICD-10-CM | POA: Insufficient documentation

## 2015-08-29 MED ORDER — BETAMETHASONE SOD PHOS & ACET 6 (3-3) MG/ML IJ SUSP
12.0000 mg | Freq: Once | INTRAMUSCULAR | Status: AC
  Administered 2015-08-29: 12 mg via INTRAMUSCULAR
  Filled 2015-08-29: qty 2

## 2015-08-30 ENCOUNTER — Encounter (INDEPENDENT_AMBULATORY_CARE_PROVIDER_SITE_OTHER): Payer: Self-pay

## 2015-08-30 ENCOUNTER — Ambulatory Visit (HOSPITAL_COMMUNITY)
Admission: RE | Admit: 2015-08-30 | Discharge: 2015-08-30 | Disposition: A | Discharge status: Home / Self Care | Payer: 59 | Source: Ambulatory Visit | Attending: Obstetrics and Gynecology | Admitting: Obstetrics and Gynecology

## 2015-08-30 ENCOUNTER — Encounter (HOSPITAL_COMMUNITY): Payer: Self-pay

## 2015-08-30 DIAGNOSIS — O09219 Supervision of pregnancy with history of pre-term labor, unspecified trimester: Secondary | ICD-10-CM

## 2015-08-30 DIAGNOSIS — O09212 Supervision of pregnancy with history of pre-term labor, second trimester: Secondary | ICD-10-CM | POA: Diagnosis not present

## 2015-08-30 DIAGNOSIS — O09522 Supervision of elderly multigravida, second trimester: Secondary | ICD-10-CM | POA: Diagnosis not present

## 2015-08-30 DIAGNOSIS — O3432 Maternal care for cervical incompetence, second trimester: Secondary | ICD-10-CM | POA: Diagnosis not present

## 2015-08-30 DIAGNOSIS — Z3A23 23 weeks gestation of pregnancy: Secondary | ICD-10-CM | POA: Diagnosis not present

## 2015-08-30 DIAGNOSIS — O09292 Supervision of pregnancy with other poor reproductive or obstetric history, second trimester: Secondary | ICD-10-CM | POA: Diagnosis not present

## 2015-08-30 MED ORDER — BETAMETHASONE SOD PHOS & ACET 6 (3-3) MG/ML IJ SUSP
12.0000 mg | Freq: Once | INTRAMUSCULAR | Status: AC
  Administered 2015-08-30: 12 mg via INTRAMUSCULAR
  Filled 2015-08-30: qty 2

## 2015-09-06 ENCOUNTER — Encounter (HOSPITAL_COMMUNITY): Payer: Self-pay

## 2015-09-06 ENCOUNTER — Ambulatory Visit (HOSPITAL_COMMUNITY)
Admission: RE | Admit: 2015-09-06 | Discharge: 2015-09-06 | Disposition: A | Discharge status: Home / Self Care | Payer: 59 | Source: Ambulatory Visit | Attending: Obstetrics and Gynecology | Admitting: Obstetrics and Gynecology

## 2015-09-06 VITALS — BP 126/72 | HR 85 | Wt 221.8 lb

## 2015-09-06 DIAGNOSIS — O09522 Supervision of elderly multigravida, second trimester: Secondary | ICD-10-CM | POA: Diagnosis not present

## 2015-09-06 DIAGNOSIS — O09212 Supervision of pregnancy with history of pre-term labor, second trimester: Secondary | ICD-10-CM | POA: Diagnosis not present

## 2015-09-06 DIAGNOSIS — O09292 Supervision of pregnancy with other poor reproductive or obstetric history, second trimester: Secondary | ICD-10-CM | POA: Diagnosis not present

## 2015-09-06 DIAGNOSIS — O3432 Maternal care for cervical incompetence, second trimester: Secondary | ICD-10-CM | POA: Insufficient documentation

## 2015-09-06 DIAGNOSIS — Z3A24 24 weeks gestation of pregnancy: Secondary | ICD-10-CM | POA: Insufficient documentation

## 2015-09-06 DIAGNOSIS — O09219 Supervision of pregnancy with history of pre-term labor, unspecified trimester: Secondary | ICD-10-CM

## 2015-09-08 DIAGNOSIS — N979 Female infertility, unspecified: Secondary | ICD-10-CM | POA: Diagnosis not present

## 2015-09-11 ENCOUNTER — Encounter (HOSPITAL_COMMUNITY): Payer: Self-pay

## 2015-09-11 ENCOUNTER — Ambulatory Visit (HOSPITAL_COMMUNITY)
Admission: RE | Admit: 2015-09-11 | Discharge: 2015-09-11 | Disposition: A | Discharge status: Home / Self Care | Payer: 59 | Source: Ambulatory Visit | Attending: Obstetrics and Gynecology | Admitting: Obstetrics and Gynecology

## 2015-09-11 DIAGNOSIS — O09522 Supervision of elderly multigravida, second trimester: Secondary | ICD-10-CM | POA: Insufficient documentation

## 2015-09-11 DIAGNOSIS — O09292 Supervision of pregnancy with other poor reproductive or obstetric history, second trimester: Secondary | ICD-10-CM | POA: Diagnosis not present

## 2015-09-11 DIAGNOSIS — O09212 Supervision of pregnancy with history of pre-term labor, second trimester: Secondary | ICD-10-CM | POA: Diagnosis not present

## 2015-09-11 DIAGNOSIS — Z3A25 25 weeks gestation of pregnancy: Secondary | ICD-10-CM | POA: Diagnosis not present

## 2015-09-11 DIAGNOSIS — O09219 Supervision of pregnancy with history of pre-term labor, unspecified trimester: Secondary | ICD-10-CM

## 2015-09-11 DIAGNOSIS — O3432 Maternal care for cervical incompetence, second trimester: Secondary | ICD-10-CM | POA: Insufficient documentation

## 2015-09-11 DIAGNOSIS — Z3A24 24 weeks gestation of pregnancy: Secondary | ICD-10-CM | POA: Diagnosis not present

## 2015-09-12 ENCOUNTER — Other Ambulatory Visit (HOSPITAL_COMMUNITY): Payer: Self-pay

## 2015-09-12 DIAGNOSIS — O3432 Maternal care for cervical incompetence, second trimester: Secondary | ICD-10-CM

## 2015-09-13 ENCOUNTER — Ambulatory Visit (HOSPITAL_COMMUNITY): Payer: 59

## 2015-09-14 DIAGNOSIS — Z36 Encounter for antenatal screening of mother: Secondary | ICD-10-CM | POA: Diagnosis not present

## 2015-09-14 DIAGNOSIS — Z3492 Encounter for supervision of normal pregnancy, unspecified, second trimester: Secondary | ICD-10-CM | POA: Diagnosis not present

## 2015-09-14 MED FILL — PROGESTERONE 200 MG CAPSULE: 200 | 30 days supply | Qty: 30 | Fill #1

## 2015-09-18 DIAGNOSIS — Z3A26 26 weeks gestation of pregnancy: Secondary | ICD-10-CM | POA: Diagnosis not present

## 2015-09-18 DIAGNOSIS — Q270 Congenital absence and hypoplasia of umbilical artery: Secondary | ICD-10-CM | POA: Diagnosis not present

## 2015-09-19 DIAGNOSIS — O99810 Abnormal glucose complicating pregnancy: Secondary | ICD-10-CM | POA: Diagnosis not present

## 2015-09-25 ENCOUNTER — Other Ambulatory Visit (HOSPITAL_COMMUNITY): Payer: Self-pay

## 2015-09-27 DIAGNOSIS — O24410 Gestational diabetes mellitus in pregnancy, diet controlled: Secondary | ICD-10-CM | POA: Diagnosis not present

## 2015-09-28 DIAGNOSIS — Z3400 Encounter for supervision of normal first pregnancy, unspecified trimester: Secondary | ICD-10-CM | POA: Diagnosis not present

## 2015-09-28 DIAGNOSIS — O24410 Gestational diabetes mellitus in pregnancy, diet controlled: Secondary | ICD-10-CM | POA: Diagnosis not present

## 2015-09-28 DIAGNOSIS — L680 Hirsutism: Secondary | ICD-10-CM | POA: Diagnosis not present

## 2015-09-28 DIAGNOSIS — E559 Vitamin D deficiency, unspecified: Secondary | ICD-10-CM | POA: Diagnosis not present

## 2015-09-28 DIAGNOSIS — E282 Polycystic ovarian syndrome: Secondary | ICD-10-CM | POA: Diagnosis not present

## 2015-09-28 DIAGNOSIS — N926 Irregular menstruation, unspecified: Secondary | ICD-10-CM | POA: Diagnosis not present

## 2015-09-28 DIAGNOSIS — N979 Female infertility, unspecified: Secondary | ICD-10-CM | POA: Diagnosis not present

## 2015-09-28 DIAGNOSIS — O3432 Maternal care for cervical incompetence, second trimester: Secondary | ICD-10-CM | POA: Diagnosis not present

## 2015-09-28 DIAGNOSIS — Z3A27 27 weeks gestation of pregnancy: Secondary | ICD-10-CM | POA: Diagnosis not present

## 2015-10-05 MED FILL — ONETOUCH VERIO TEST STRIP: 30 days supply | Qty: 200 | Fill #0

## 2015-10-05 MED FILL — ONE TOUCH DELICA 33G LANCET: 30 days supply | Qty: 200 | Fill #0

## 2015-10-06 ENCOUNTER — Encounter (HOSPITAL_COMMUNITY): Payer: Self-pay

## 2015-10-09 ENCOUNTER — Ambulatory Visit (HOSPITAL_COMMUNITY)
Admission: RE | Admit: 2015-10-09 | Discharge: 2015-10-09 | Disposition: A | Discharge status: Home / Self Care | Payer: 59 | Source: Ambulatory Visit | Attending: Obstetrics and Gynecology | Admitting: Obstetrics and Gynecology

## 2015-10-09 ENCOUNTER — Other Ambulatory Visit (HOSPITAL_COMMUNITY): Payer: Self-pay | Admitting: Maternal and Fetal Medicine

## 2015-10-09 ENCOUNTER — Encounter (HOSPITAL_COMMUNITY): Payer: Self-pay

## 2015-10-09 DIAGNOSIS — O3433 Maternal care for cervical incompetence, third trimester: Secondary | ICD-10-CM | POA: Insufficient documentation

## 2015-10-09 DIAGNOSIS — Z3A29 29 weeks gestation of pregnancy: Secondary | ICD-10-CM | POA: Diagnosis not present

## 2015-10-09 DIAGNOSIS — O24410 Gestational diabetes mellitus in pregnancy, diet controlled: Secondary | ICD-10-CM | POA: Diagnosis not present

## 2015-10-09 DIAGNOSIS — O09213 Supervision of pregnancy with history of pre-term labor, third trimester: Secondary | ICD-10-CM | POA: Diagnosis not present

## 2015-10-09 DIAGNOSIS — E559 Vitamin D deficiency, unspecified: Secondary | ICD-10-CM | POA: Diagnosis not present

## 2015-10-09 DIAGNOSIS — O09219 Supervision of pregnancy with history of pre-term labor, unspecified trimester: Secondary | ICD-10-CM

## 2015-10-09 DIAGNOSIS — O3432 Maternal care for cervical incompetence, second trimester: Secondary | ICD-10-CM
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
## 2015-10-11 DIAGNOSIS — S161XXA Strain of muscle, fascia and tendon at neck level, initial encounter: Secondary | ICD-10-CM | POA: Diagnosis not present

## 2015-10-12 DIAGNOSIS — N979 Female infertility, unspecified: Secondary | ICD-10-CM | POA: Diagnosis not present

## 2015-10-12 DIAGNOSIS — N926 Irregular menstruation, unspecified: Secondary | ICD-10-CM | POA: Diagnosis not present

## 2015-10-12 DIAGNOSIS — L680 Hirsutism: Secondary | ICD-10-CM | POA: Diagnosis not present

## 2015-10-12 DIAGNOSIS — E282 Polycystic ovarian syndrome: Secondary | ICD-10-CM | POA: Diagnosis not present

## 2015-10-12 DIAGNOSIS — O24410 Gestational diabetes mellitus in pregnancy, diet controlled: Secondary | ICD-10-CM | POA: Diagnosis not present

## 2015-10-12 DIAGNOSIS — E559 Vitamin D deficiency, unspecified: Secondary | ICD-10-CM | POA: Diagnosis not present

## 2015-10-19 DIAGNOSIS — Z3400 Encounter for supervision of normal first pregnancy, unspecified trimester: Secondary | ICD-10-CM | POA: Diagnosis not present

## 2015-10-19 DIAGNOSIS — Z3A30 30 weeks gestation of pregnancy: Secondary | ICD-10-CM | POA: Diagnosis not present

## 2015-10-20 DIAGNOSIS — O24410 Gestational diabetes mellitus in pregnancy, diet controlled: Secondary | ICD-10-CM | POA: Diagnosis not present

## 2015-10-20 DIAGNOSIS — M62838 Other muscle spasm: Secondary | ICD-10-CM | POA: Diagnosis not present

## 2015-10-20 DIAGNOSIS — B001 Herpesviral vesicular dermatitis: Secondary | ICD-10-CM | POA: Diagnosis not present

## 2015-10-20 DIAGNOSIS — M542 Cervicalgia: Secondary | ICD-10-CM | POA: Diagnosis not present

## 2015-10-20 MED FILL — PROGESTERONE 200 MG CAPSULE: 200 | 30 days supply | Qty: 30 | Fill #2

## 2015-10-20 MED FILL — DENAVIR 1% CREAM: 1 | 10 days supply | Qty: 5 | Fill #0

## 2015-10-24 DIAGNOSIS — N979 Female infertility, unspecified: Secondary | ICD-10-CM | POA: Diagnosis not present

## 2015-10-24 DIAGNOSIS — E282 Polycystic ovarian syndrome: Secondary | ICD-10-CM | POA: Diagnosis not present

## 2015-10-24 DIAGNOSIS — O24410 Gestational diabetes mellitus in pregnancy, diet controlled: Secondary | ICD-10-CM | POA: Diagnosis not present

## 2015-10-24 DIAGNOSIS — L680 Hirsutism: Secondary | ICD-10-CM | POA: Diagnosis not present

## 2015-10-24 DIAGNOSIS — E559 Vitamin D deficiency, unspecified: Secondary | ICD-10-CM | POA: Diagnosis not present

## 2015-10-24 DIAGNOSIS — N926 Irregular menstruation, unspecified: Secondary | ICD-10-CM | POA: Diagnosis not present

## 2015-10-27 DIAGNOSIS — O24419 Gestational diabetes mellitus in pregnancy, unspecified control: Secondary | ICD-10-CM | POA: Diagnosis not present

## 2015-10-27 DIAGNOSIS — Z3400 Encounter for supervision of normal first pregnancy, unspecified trimester: Secondary | ICD-10-CM | POA: Diagnosis not present

## 2015-10-27 DIAGNOSIS — Z3A31 31 weeks gestation of pregnancy: Secondary | ICD-10-CM | POA: Diagnosis not present

## 2015-10-27 MED FILL — VALACYCLOVIR HCL 500 MG TAB: 500 | 30 days supply | Qty: 30 | Fill #0

## 2015-10-29 ENCOUNTER — Inpatient Hospital Stay (HOSPITAL_COMMUNITY): Payer: 59

## 2015-10-29 ENCOUNTER — Encounter (HOSPITAL_COMMUNITY): Payer: Self-pay

## 2015-10-29 ENCOUNTER — Inpatient Hospital Stay (HOSPITAL_COMMUNITY)
Admission: AD | Admit: 2015-10-29 | Discharge: 2015-11-08 | DRG: 765 | Disposition: A | Discharge status: Home / Self Care | Payer: 59 | Source: Ambulatory Visit | Attending: Obstetrics & Gynecology | Admitting: Obstetrics & Gynecology

## 2015-10-29 DIAGNOSIS — O3433 Maternal care for cervical incompetence, third trimester: Secondary | ICD-10-CM | POA: Diagnosis not present

## 2015-10-29 DIAGNOSIS — O09293 Supervision of pregnancy with other poor reproductive or obstetric history, third trimester: Secondary | ICD-10-CM | POA: Diagnosis not present

## 2015-10-29 DIAGNOSIS — Z23 Encounter for immunization: Secondary | ICD-10-CM | POA: Diagnosis not present

## 2015-10-29 DIAGNOSIS — Z3A32 32 weeks gestation of pregnancy: Secondary | ICD-10-CM | POA: Diagnosis not present

## 2015-10-29 DIAGNOSIS — O289 Unspecified abnormal findings on antenatal screening of mother: Secondary | ICD-10-CM | POA: Diagnosis not present

## 2015-10-29 DIAGNOSIS — O34211 Maternal care for low transverse scar from previous cesarean delivery: Secondary | ICD-10-CM | POA: Diagnosis not present

## 2015-10-29 DIAGNOSIS — O42913 Preterm premature rupture of membranes, unspecified as to length of time between rupture and onset of labor, third trimester: Secondary | ICD-10-CM | POA: Diagnosis not present

## 2015-10-29 DIAGNOSIS — N96 Recurrent pregnancy loss: Secondary | ICD-10-CM | POA: Diagnosis present

## 2015-10-29 DIAGNOSIS — Z6836 Body mass index (BMI) 36.0-36.9, adult: Secondary | ICD-10-CM | POA: Diagnosis not present

## 2015-10-29 DIAGNOSIS — O99214 Obesity complicating childbirth: Secondary | ICD-10-CM | POA: Diagnosis present

## 2015-10-29 DIAGNOSIS — O24424 Gestational diabetes mellitus in childbirth, insulin controlled: Secondary | ICD-10-CM | POA: Diagnosis not present

## 2015-10-29 DIAGNOSIS — IMO0002 Reserved for concepts with insufficient information to code with codable children: Secondary | ICD-10-CM

## 2015-10-29 DIAGNOSIS — Z3A31 31 weeks gestation of pregnancy: Secondary | ICD-10-CM

## 2015-10-29 DIAGNOSIS — O42919 Preterm premature rupture of membranes, unspecified as to length of time between rupture and onset of labor, unspecified trimester: Secondary | ICD-10-CM

## 2015-10-29 DIAGNOSIS — O4290 Premature rupture of membranes, unspecified as to length of time between rupture and onset of labor, unspecified weeks of gestation: Secondary | ICD-10-CM | POA: Diagnosis not present

## 2015-10-29 DIAGNOSIS — O09523 Supervision of elderly multigravida, third trimester: Secondary | ICD-10-CM | POA: Diagnosis not present

## 2015-10-29 DIAGNOSIS — Z98891 History of uterine scar from previous surgery: Secondary | ICD-10-CM | POA: Diagnosis not present

## 2015-10-29 DIAGNOSIS — O411230 Chorioamnionitis, third trimester, not applicable or unspecified: Secondary | ICD-10-CM | POA: Diagnosis not present

## 2015-10-29 DIAGNOSIS — O09813 Supervision of pregnancy resulting from assisted reproductive technology, third trimester: Secondary | ICD-10-CM | POA: Diagnosis not present

## 2015-10-29 DIAGNOSIS — Z36 Encounter for antenatal screening of mother: Secondary | ICD-10-CM | POA: Diagnosis not present

## 2015-10-29 DIAGNOSIS — Z3A33 33 weeks gestation of pregnancy: Secondary | ICD-10-CM | POA: Diagnosis not present

## 2015-10-29 DIAGNOSIS — O24414 Gestational diabetes mellitus in pregnancy, insulin controlled: Secondary | ICD-10-CM | POA: Diagnosis not present

## 2015-10-29 HISTORY — DX: Gestational diabetes mellitus in pregnancy, unspecified control: O24.419

## 2015-10-29 LAB — GLUCOSE, CAPILLARY
GLUCOSE-CAPILLARY: 168 mg/dL — AB (ref 65–99)
Glucose-Capillary: 149 mg/dL — ABNORMAL HIGH (ref 65–99)
Glucose-Capillary: 161 mg/dL — ABNORMAL HIGH (ref 65–99)

## 2015-10-29 LAB — COMPREHENSIVE METABOLIC PANEL
ALK PHOS: 69 U/L (ref 38–126)
ALT: 12 U/L — ABNORMAL LOW (ref 14–54)
ANION GAP: 8 (ref 5–15)
AST: 18 U/L (ref 15–41)
Albumin: 2.7 g/dL — ABNORMAL LOW (ref 3.5–5.0)
BILIRUBIN TOTAL: 0.2 mg/dL — AB (ref 0.3–1.2)
BUN: 7 mg/dL (ref 6–20)
CALCIUM: 8.8 mg/dL — AB (ref 8.9–10.3)
CO2: 21 mmol/L — ABNORMAL LOW (ref 22–32)
Chloride: 106 mmol/L (ref 101–111)
Creatinine, Ser: 0.53 mg/dL (ref 0.44–1.00)
GFR calc non Af Amer: >60 mL/min (ref >60)
Glucose, Bld: 113 mg/dL — ABNORMAL HIGH (ref 65–99)
POTASSIUM: 4 mmol/L (ref 3.5–5.1)
SODIUM: 135 mmol/L (ref 135–145)
TOTAL PROTEIN: 6 g/dL — AB (ref 6.5–8.1)

## 2015-10-29 LAB — CBC
HEMATOCRIT: 30.9 % — AB (ref 36.0–46.0)
HEMOGLOBIN: 10.4 g/dL — AB (ref 12.0–15.0)
MCH: 27.3 pg (ref 26.0–34.0)
MCHC: 33.7 g/dL (ref 30.0–36.0)
MCV: 81.1 fL (ref 78.0–100.0)
Platelets: 247 10*3/uL (ref 150–400)
RBC: 3.81 MIL/uL — ABNORMAL LOW (ref 3.87–5.11)
RDW: 14.2 % (ref 11.5–15.5)
WBC: 5.1 10*3/uL (ref 4.0–10.5)

## 2015-10-29 LAB — TYPE AND SCREEN
ABO/RH(D): O POS
ANTIBODY SCREEN: NEGATIVE

## 2015-10-29 LAB — POCT FERN TEST: POCT FERN TEST: POSITIVE

## 2015-10-29 MED ORDER — ZOLPIDEM TARTRATE 5 MG PO TABS
5.0000 mg | ORAL_TABLET | Freq: Every evening | ORAL | Status: DC | PRN
  Administered 2015-10-29: 5 mg via ORAL
  Filled 2015-10-29: qty 1

## 2015-10-29 MED ORDER — BETAMETHASONE SOD PHOS & ACET 6 (3-3) MG/ML IJ SUSP
12.0000 mg | INTRAMUSCULAR | Status: AC
  Administered 2015-10-29 – 2015-10-30 (×2): 12 mg via INTRAMUSCULAR
  Filled 2015-10-29 (×2): qty 2

## 2015-10-29 MED ORDER — VALACYCLOVIR HCL 500 MG PO TABS
500.0000 mg | ORAL_TABLET | Freq: Every day | ORAL | Status: DC
  Administered 2015-10-29 – 2015-11-04 (×7): 500 mg via ORAL
  Filled 2015-10-29 (×8): qty 1

## 2015-10-29 MED ORDER — LACTATED RINGERS IV SOLN
INTRAVENOUS | Status: DC
  Administered 2015-10-29 – 2015-10-31 (×4): via INTRAVENOUS

## 2015-10-29 MED ORDER — MAGNESIUM SULFATE 50 % IJ SOLN
2.0000 g/h | INTRAVENOUS | Status: DC
  Administered 2015-10-31: 2 g/h via INTRAVENOUS
  Filled 2015-10-29 (×3): qty 80

## 2015-10-29 MED ORDER — CALCIUM CARBONATE ANTACID 500 MG PO CHEW: 2.0000 | CHEWABLE_TABLET | ORAL | Status: DC | PRN

## 2015-10-29 MED ORDER — INSULIN GLARGINE 100 UNIT/ML ~~LOC~~ SOLN
4.0000 [IU] | Freq: Every day | SUBCUTANEOUS | Status: DC
  Administered 2015-10-29: 4 [IU] via SUBCUTANEOUS
  Filled 2015-10-29 (×2): qty 0.04

## 2015-10-29 MED ORDER — ACETAMINOPHEN 325 MG PO TABS
650.0000 mg | ORAL_TABLET | ORAL | Status: DC | PRN
  Administered 2015-10-30 – 2015-11-05 (×3): 650 mg via ORAL
  Filled 2015-10-29 (×3): qty 2

## 2015-10-29 MED ORDER — AMOXICILLIN 500 MG PO CAPS
500.0000 mg | ORAL_CAPSULE | Freq: Three times a day (TID) | ORAL | Status: AC
  Administered 2015-10-31 – 2015-11-04 (×15): 500 mg via ORAL
  Filled 2015-10-29 (×15): qty 1

## 2015-10-29 MED ORDER — INSULIN ASPART 100 UNIT/ML ~~LOC~~ SOLN
0.0000 [IU] | Freq: Three times a day (TID) | SUBCUTANEOUS | Status: DC
  Administered 2015-10-29 (×2): 3 [IU] via SUBCUTANEOUS
  Administered 2015-10-30: 4 [IU] via SUBCUTANEOUS
  Administered 2015-10-30: 3 [IU] via SUBCUTANEOUS

## 2015-10-29 MED ORDER — AZITHROMYCIN 250 MG PO TABS
500.0000 mg | ORAL_TABLET | Freq: Every day | ORAL | Status: AC
  Administered 2015-10-31 – 2015-11-04 (×5): 500 mg via ORAL
  Filled 2015-10-29 (×2): qty 2
  Filled 2015-10-29 (×2): qty 1
  Filled 2015-10-29: qty 2

## 2015-10-29 MED ORDER — SODIUM CHLORIDE 0.9 % IV SOLN
2.0000 g | Freq: Four times a day (QID) | INTRAVENOUS | Status: AC
  Administered 2015-10-29 – 2015-10-31 (×8): 2 g via INTRAVENOUS
  Filled 2015-10-29 (×9): qty 2000

## 2015-10-29 MED ORDER — DOCUSATE SODIUM 100 MG PO CAPS
100.0000 mg | ORAL_CAPSULE | Freq: Every day | ORAL | Status: DC
  Administered 2015-10-29 – 2015-11-04 (×7): 100 mg via ORAL
  Filled 2015-10-29 (×7): qty 1

## 2015-10-29 MED ORDER — MAGNESIUM SULFATE BOLUS VIA INFUSION
4.0000 g | Freq: Once | INTRAVENOUS | Status: AC
  Administered 2015-10-29: 4 g via INTRAVENOUS
  Filled 2015-10-29: qty 500

## 2015-10-29 MED ORDER — PRENATAL MULTIVITAMIN CH
1.0000 | ORAL_TABLET | Freq: Every day | ORAL | Status: DC
  Administered 2015-10-29 – 2015-11-04 (×6): 1 via ORAL
  Filled 2015-10-29 (×6): qty 1

## 2015-10-29 MED ORDER — DEXTROSE 5 % IV SOLN
500.0000 mg | INTRAVENOUS | Status: AC
  Administered 2015-10-29 – 2015-10-30 (×2): 500 mg via INTRAVENOUS
  Filled 2015-10-29 (×2): qty 500

## 2015-10-29 NOTE — MAU Provider Note (Signed)
History     CSN: [id number]  Arrival date and time: 10/29/15 4782   First Provider Initiated Contact with Patient 10/29/15 0536      Chief Complaint  Patient presents with  . Rupture of Membranes   HPI Andrea Young is a 40 y.o. G2P0100 at [redacted]w[redacted]d who presents with leaking of fluid & low back pain. Reports leaking large amount of clear fluid since 4 am; continues to leak since then. Also feels contractions; mainly in her low back. Rates pain 4/10. Denies vaginal bleeding. Positive fetal movement.  Cerclage in place d/t CI. BMZ 7/11 & 7/12.    OB History    Gravida Para Term Preterm AB Living   2 1   1    0   SAB TAB Ectopic Multiple Live Births           1      Obstetric Comments   Twin pregnancy, Twin A PPROM and vaginal delivery on 08-11-14(SAB) demise.  Twin B, C/S for breech, PTL, Plac abruption on 09/2014 at [redacted]w[redacted]d.  Baby to NICU.  Baby B also passed away in NICU. LTCS per op note      Past Medical History:  Diagnosis Date  . Anemia    history  . Complication of anesthesia   . Gestational diabetes   . PCOS (polycystic ovarian syndrome)    treatment with metformin  . PONV (postoperative nausea and vomiting)     Past Surgical History:  Procedure Laterality Date  . bilateral foot surgery    . CERVICAL CERCLAGE N/A 06/19/2015   Procedure: CERCLAGE CERVICAL;  Surgeon: Marcelle Overlie, MD;  Location: [facility];  Service: Gynecology;  Laterality: N/A;  . CESAREAN SECTION N/A 09/26/2014   Procedure: CESAREAN SECTION;  Surgeon: Marcelle Overlie, MD;  Location: [facility];  Service: Obstetrics;  Laterality: N/A;  . DILATATION & CURRETTAGE/HYSTEROSCOPY WITH RESECTOCOPE  02/03/2012   Procedure: DILATATION & CURETTAGE/HYSTEROSCOPY WITH RESECTOCOPE;  Surgeon: Leslie Andrea, MD;  Location: [facility];  Service: Gynecology;  Laterality: N/A;  no resection done  . DILATION AND CURETTAGE OF UTERUS     x 2 for polyp removal  . HAND SURGERY     as a child -tendons & ligaments repair  .  polypectomy    . WISDOM TOOTH EXTRACTION      Family History  Problem Relation Age of Onset  . Diabetes Mother   . Hypertension Father   . Hyperlipidemia Father     Social History  Substance Use Topics  . Smoking status: Never Smoker  . Smokeless tobacco: Never Used  . Alcohol use No    Allergies: No Known Allergies  Prescriptions Prior to Admission  Medication Sig Dispense Refill Last Dose  . acetaminophen (TYLENOL) 325 MG tablet Take 325 mg by mouth every 6 (six) hours as needed for headache.   Past Week at Unknown time  . Cholecalciferol (VITAMIN D) 2000 UNITS CAPS Take 1 capsule by mouth daily.   10/28/2015 at Unknown time  . clotrimazole (LOTRIMIN) 1 % cream Apply 1 application topically 2 (two) times daily.     Marland Kitchen docusate sodium (COLACE) 100 MG capsule Take 100 mg by mouth daily.    10/28/2015 at Unknown time  . Doxylamine Succinate, Sleep, (UNISOM PO) Take by mouth.   10/28/2015 at Unknown time  . insulin glargine (LANTUS) 100 UNIT/ML injection Inject 8 Units into the skin at bedtime.   10/28/2015 at Unknown time  . Prenatal Vit-Fe Fumarate-FA (PRENATAL MULTIVITAMIN) TABS  tablet Take 1 tablet by mouth daily at 12 noon.   10/28/2015 at Unknown time  . PRESCRIPTION MEDICATION "Progesterone injections once a week."   Past Week at Unknown time  . progesterone 200 MG SUPP Place 200 mg vaginally at bedtime.   Past Week at Unknown time    Review of Systems  Gastrointestinal: Positive for abdominal pain.  Genitourinary:       + LOF No vaginal bleeding  Musculoskeletal: Positive for back pain.   Physical Exam   Blood pressure 110/70, pulse 78, temperature 97.3 F (36.3 C), resp. rate 18, height 5\' 7"  (1.702 m), weight 221 lb (100.2 kg), last menstrual period 03/24/2015, unknown if currently breastfeeding.  Physical Exam  Nursing note and vitals reviewed. Constitutional: She is oriented to person, place, and time. She appears well-developed and well-nourished. No distress.  HENT:   Head: Normocephalic and atraumatic.  Eyes: Conjunctivae are normal. Right eye exhibits no discharge. Left eye exhibits no discharge. No scleral icterus.  Neck: Normal range of motion.  Respiratory: Effort normal. No respiratory distress.  Genitourinary:  Genitourinary Comments: + pooling moderate amount of clear fluid  Neurological: She is alert and oriented to person, place, and time.  Skin: Skin is warm and dry. She is not diaphoretic.  Psychiatric: She has a normal mood and affect. Her behavior is normal. Judgment and thought content normal.   Exam by:: Cerclage in place  per Judeth HornErin Amyriah Buras NP Cervix closed  Fetal Tracing:  Baseline: 130 Variability: moderate Accelerations: 15x15 Decelerations: none  Toco: initially ctx every 2-6 mins; none tracing after TOCO adjusted  MAU Course  Procedures Results for orders placed or performed during the hospital encounter of 10/29/15 (from the past 24 hour(s))  Fern Test     Status: None   Collection Time: 10/29/15  5:28 AM  Result Value Ref Range   POCT Fern Test Positive = ruptured amniotic membanes   CBC     Status: Abnormal   Collection Time: 10/29/15  6:00 AM  Result Value Ref Range   WBC 5.1 4.0 - 10.5 K/uL   RBC 3.81 (L) 3.87 - 5.11 MIL/uL   Hemoglobin 10.4 (L) 12.0 - 15.0 g/dL   HCT 04.530.9 (L) 40.936.0 - 81.146.0 %   MCV 81.1 78.0 - 100.0 fL   MCH 27.3 26.0 - 34.0 pg   MCHC 33.7 30.0 - 36.0 g/dL   RDW 91.414.2 78.211.5 - 95.615.5 %   Platelets 247 150 - 400 K/uL  Comprehensive metabolic panel     Status: Abnormal   Collection Time: 10/29/15  6:00 AM  Result Value Ref Range   Sodium 135 135 - 145 mmol/L   Potassium 4.0 3.5 - 5.1 mmol/L   Chloride 106 101 - 111 mmol/L   CO2 21 (L) 22 - 32 mmol/L   Glucose, Bld 113 (H) 65 - 99 mg/dL   BUN 7 6 - 20 mg/dL   Creatinine, Ser 2.130.53 0.44 - 1.00 mg/dL   Calcium 8.8 (L) 8.9 - 10.3 mg/dL   Total Protein 6.0 (L) 6.5 - 8.1 g/dL   Albumin 2.7 (L) 3.5 - 5.0 g/dL   AST 18 15 - 41 U/L   ALT 12 (L) 14 -  54 U/L   Alkaline Phosphatase 69 38 - 126 U/L   Total Bilirubin 0.2 (L) 0.3 - 1.2 mg/dL   GFR calc non Af Amer >60 >60 mL/min   GFR calc Af Amer >60 >60 mL/min   Anion gap 8 5 - 15  MDM Reactive fetal tracing + pooling, + fern Cervix closed S/w Dr. Henderson Cloud; will admit to antenatal. IV abx, mag 4/2 for CP prophylaxis, BMZ, ultrasound for presentation  Assessment and Plan  A: 1. Preterm premature rupture of membranes (PPROM) with unknown onset of labor   2. [redacted] weeks gestation of pregnancy     P; Admit to antenatal for PPROM  Judeth Horn 10/29/2015, 6:51 AM

## 2015-10-29 NOTE — H&P (Signed)
Andrea Young is a 40 y.o. female presenting for gush of clear fluid about 4am. It awoke her from sleep. This was followed by some lower back pain that was 3/10, now better. Pregnancy complicated by history of incompetent cervix and twin loss with cesarean section for baby B about 23 weeks. Neither baby survived. Now has IVF pregnancy with McDonald cervical cerclage, receiving weekly progesterone and MFM consultation done. Hx of PCOS and now gestational DM treated by ednocrinologist, Dr Altheimer, with Lantus qhs. FBS have been 90-110s so far. Evaluation in MAU is positive for gross ROM and positive fern. Cervix closed, stitch intact to exam in MAU. OB History    Gravida Para Term Preterm AB Living   2 1   1    0   SAB TAB Ectopic Multiple Live Births           1      Obstetric Comments   Twin pregnancy, Twin A PPROM and vaginal delivery on 08-11-14(SAB) demise.  Twin B, C/S for breech, PTL, Plac abruption on 09/2014 at [redacted]w[redacted]d.  Baby to NICU.  Baby B also passed away in NICU. LTCS per op note     Past Medical History:  Diagnosis Date  . Anemia    history  . Complication of anesthesia   . Gestational diabetes   . PCOS (polycystic ovarian syndrome)    treatment with metformin  . PONV (postoperative nausea and vomiting)    Past Surgical History:  Procedure Laterality Date  . bilateral foot surgery    . CERVICAL CERCLAGE N/A 06/19/2015   Procedure: CERCLAGE CERVICAL;  Surgeon: Marcelle Overlie, MD;  Location: [facility];  Service: Gynecology;  Laterality: N/A;  . CESAREAN SECTION N/A 09/26/2014   Procedure: CESAREAN SECTION;  Surgeon: Marcelle Overlie, MD;  Location: [facility];  Service: Obstetrics;  Laterality: N/A;  . DILATATION & CURRETTAGE/HYSTEROSCOPY WITH RESECTOCOPE  02/03/2012   Procedure: DILATATION & CURETTAGE/HYSTEROSCOPY WITH RESECTOCOPE;  Surgeon: Leslie Andrea, MD;  Location: [facility];  Service: Gynecology;  Laterality: N/A;  no resection done  . DILATION AND CURETTAGE OF UTERUS     x 2 for polyp removal  . HAND SURGERY     as a child -tendons & ligaments repair  . polypectomy    . WISDOM TOOTH EXTRACTION     Family History: family history includes Diabetes in her mother; Hyperlipidemia in her father; Hypertension in her father. Social History:  reports that she has never smoked. She has never used smokeless tobacco. She reports that she does not drink alcohol or use drugs.     Maternal Diabetes: Yes:  Diabetes Type:  Insulin/Medication controlled Genetic Screening: Normal Maternal Ultrasounds/Referrals: Normal Fetal Ultrasounds or other Referrals:  Referred to Materal Fetal Medicine  Maternal Substance Abuse:  No Significant Maternal Medications:  Insulin-Lantus @ HS Significant Maternal Lab Results:  None Other Comments:  None  Review of Systems  Constitutional: Negative for fever.  Eyes: Negative for blurred vision.  Neurological: Negative for headaches.   Maternal Medical History:  Reason for admission: Rupture of membranes.   Contractions: Onset was 3-5 hours ago.   Frequency: irregular.   Perceived severity is mild.    Fetal activity: Perceived fetal activity is normal.    Prenatal Complications - Diabetes: type 2. Diabetes is managed by insulin injections.      Exam by:: Cerclage in place  per Judeth Horn NP Blood pressure (!) 103/55, pulse 67, temperature 98.3 F (36.8 C), temperature source Oral, resp.  rate 18, height 5\' 7"  (1.702 m), weight 221 lb (100.2 kg), last menstrual period 03/24/2015, unknown if currently breastfeeding. Maternal Exam:  Uterine Assessment: Contraction strength is mild.  Contraction frequency is irregular.   Abdomen: Patient reports no abdominal tenderness.   Fetal Exam Fetal State Assessment: Category I - tracings are normal.     Physical Exam  Cardiovascular: Normal rate and regular rhythm.   Respiratory: Effort normal and breath sounds normal.  GI: Soft. There is no tenderness.  Neurological: She has  normal reflexes.    Prenatal labs: ABO, Rh: --/--/O POS (09/10 0600) Antibody: NEG (09/10 0600) Rubella:   RPR:    HBsAg:    HIV:    GBS:     U/S today=vertex, AFI <3% C/W PPROM, anterior placenta  Assessment/Plan: 40 yo G2P1 IVF pregnancy @ 32 1/7 weeks by transfer dates Incompetent cervix-McDonald cervical cerclage in place       Remove after BMTZ series complete PPROM      Vertex       BMTZ series ordered       Magnesium sulfate for neuroprotection and BMTZ series       Antibiotics per PPROM protocol GDM on insulin @ HS      FBS today= 113. Will increase Lantus to 9 Units this HS and monitor glucose Previous cesarean section       D/W patient TOLAC vs repeat cesarean section and risks. She is considering.    Ranetta Armacost II,Quinnley Colasurdo E 10/29/2015, 8:10 AM

## 2015-10-29 NOTE — MAU Note (Signed)
Leaking fld for about an hour. Cerclage in place as of Friday at office appt and cervix closed. Cl fld

## 2015-10-30 ENCOUNTER — Ambulatory Visit (HOSPITAL_COMMUNITY)
Admit: 2015-10-30 | Discharge: 2015-10-30 | Disposition: A | Discharge status: Home / Self Care | Payer: 59 | Attending: Obstetrics and Gynecology | Admitting: Obstetrics and Gynecology

## 2015-10-30 LAB — CBC
HEMATOCRIT: 27.8 % — AB (ref 36.0–46.0)
HEMOGLOBIN: 9.4 g/dL — AB (ref 12.0–15.0)
MCH: 27.5 pg (ref 26.0–34.0)
MCHC: 33.8 g/dL (ref 30.0–36.0)
MCV: 81.3 fL (ref 78.0–100.0)
Platelets: 260 10*3/uL (ref 150–400)
RBC: 3.42 MIL/uL — ABNORMAL LOW (ref 3.87–5.11)
RDW: 14.2 % (ref 11.5–15.5)
WBC: 7 10*3/uL (ref 4.0–10.5)

## 2015-10-30 LAB — GLUCOSE, CAPILLARY
GLUCOSE-CAPILLARY: 136 mg/dL — AB (ref 65–99)
GLUCOSE-CAPILLARY: 203 mg/dL — AB (ref 65–99)
GLUCOSE-CAPILLARY: 235 mg/dL — AB (ref 65–99)
Glucose-Capillary: 134 mg/dL — ABNORMAL HIGH (ref 65–99)
Glucose-Capillary: 169 mg/dL — ABNORMAL HIGH (ref 65–99)

## 2015-10-30 MED ORDER — INSULIN ASPART 100 UNIT/ML ~~LOC~~ SOLN
0.0000 [IU] | Freq: Four times a day (QID) | SUBCUTANEOUS | Status: DC
  Administered 2015-10-30: 4 [IU] via SUBCUTANEOUS
  Administered 2015-10-31 – 2015-11-04 (×10): 2 [IU] via SUBCUTANEOUS

## 2015-10-30 MED ORDER — INSULIN GLARGINE 100 UNIT/ML ~~LOC~~ SOLN
5.0000 [IU] | Freq: Every day | SUBCUTANEOUS | Status: DC
  Administered 2015-10-30: 5 [IU] via SUBCUTANEOUS
  Filled 2015-10-30: qty 0.05

## 2015-10-30 NOTE — Consult Note (Signed)
MFM Note  [name] is a 40 year old 602P0100 AA female at 32+[redacted] week gestation who was admitted last PM for grossly ruptured membranes. Her prenatal course was significant for cervical insufficiency, McDonald cerclage placed at 13 weeks, AMA, IVF pregnancy, single umbilical artery and gestational diabetes requiring insulin. She is well known to the MFM service in that we followed her cervix with US. At ~22 weeks, there was funneling to the cerclage with 8 mms of closed cervix. By 29 weeks, the funneling was just past the cerclage with the closed distal portion measuring 6 mms.  Since admission she has received magnesium sulfate for neuropx, antibiotics per PPROM protocol and a second course of BMZ.  US yesterday: cephalic presentation, AFI 6.6  Assessment: 1) SIUP at 32+2 weeks 2) Cervical insufficiency; 17P and vaginal progesterone 3) Prophylactic cerclage placement 4) H/O loss of twin gestation: twin A at 15+ weeks and twin B at 23+ weeks with neonatal demise 5) Single umbilical artery - appeared to be an isolated finding; growth appropriate 6) AMA; S/P low risk NIPS 7) Gestational diabetes requiring insulin  We discussed whether to remove the cerclage or leave it in place. The literature does not clearly favor one management over the other. Dr. Henderson Cloudomblin noted that his plan would be to remove the cerclage after her course of BMZ. We also reviewed the recent literature advocating extending the latency time for delivery in the setting of PPROM to past 34 weeks - until 35 or 36 weeks. Our group still recommends a 34 week delivery but would respect opinions to go further with no s/s of infection.  Agree with current management plan to remove cerclage 24 hours after second BMZ, to continue po antibiotics per protocol, to dc magnesium sulfate tomorrow and to adjust insulin dosing as indicated. Then, expectant management until 34-36 weeks.  Thank you. Please do not hesitate to call with any questions  or concerns.  (Face-to-face consultation with patient: 45 min)

## 2015-10-30 NOTE — Progress Notes (Signed)
Awaiting MFM consult note, but verbally rec to remove cerclage in am and then D/C MgSO4, transition to PO ABX, pt/husband desire TOL for delivery plan

## 2015-10-30 NOTE — Progress Notes (Signed)
Also, insulin orders adjusted to affect glycemic control>>per diabetes coord recs

## 2015-10-30 NOTE — Progress Notes (Signed)
Inpatient Diabetes Program Recommendations  AACE/ADA: New Consensus Statement on Inpatient Glycemic Control (2015)  Target Ranges:  Prepandial:   less than 140 mg/dL      Peak postprandial:   less than 180 mg/dL (1-2 hours)      Critically ill patients:  140 - 180 mg/dL    Review of Glycemic Control  Current orders for Inpatient glycemic control: Lantus 4 units QAM, Novolog 0-14 units TID with meals  Inpatient Diabetes Program Recommendations: Insulin - Basal: Please consider increasing Lantus to 5 units QHS. Correction (SSI): Please consider changing frequency of Novolog to QID (fasting and 2 hour post prandial).  Thanks, Andrea PennerMarie Mulki Roesler, RN, MSN, CDE Diabetes Coordinator Inpatient Diabetes Program [phone] (Team Pager from 8am to 5pm) [phone] ([facility] office) [phone] [facility] office) [phone] [facility] office)

## 2015-10-31 ENCOUNTER — Encounter (HOSPITAL_COMMUNITY): Payer: Self-pay | Admitting: *Deleted

## 2015-10-31 ENCOUNTER — Inpatient Hospital Stay (HOSPITAL_COMMUNITY): Payer: 59

## 2015-10-31 LAB — GLUCOSE, CAPILLARY
GLUCOSE-CAPILLARY: 128 mg/dL — AB (ref 65–99)
Glucose-Capillary: 133 mg/dL — ABNORMAL HIGH (ref 65–99)
Glucose-Capillary: 127 mg/dL — ABNORMAL HIGH (ref 65–99)

## 2015-10-31 MED ORDER — INSULIN GLARGINE 100 UNIT/ML ~~LOC~~ SOLN
6.0000 [IU] | Freq: Every day | SUBCUTANEOUS | Status: DC
  Administered 2015-10-31 – 2015-11-04 (×5): 6 [IU] via SUBCUTANEOUS
  Filled 2015-10-31 (×5): qty 0.06

## 2015-10-31 MED ORDER — FERROUS SULFATE 325 (65 FE) MG PO TABS
325.0000 mg | ORAL_TABLET | Freq: Two times a day (BID) | ORAL | Status: DC
  Administered 2015-11-02 – 2015-11-04 (×6): 325 mg via ORAL
  Filled 2015-10-31 (×7): qty 1

## 2015-10-31 NOTE — Progress Notes (Signed)
S: no complaints. Cont to feel comf.   O:  Vitals:   10/31/15 1000 10/31/15 1635  BP: (!) 108/59   Pulse: 71   Resp: 18   Temp: 98.5 F (36.9 C) 97.8 F (36.6 C)   Gen: NAD Abd: nontender GYN: no abnl d/c  SVE deferred, was unchanged at 1300  BPP 8/10  A/P: Pt is a G2P0100 here with PPROM, curring @ 32.3wga, now s/p removal of cerclage and being observed until IOL @ 34wga or prn sooner.   # PPROM: no s/s of infection, abruption or labor. - Counseled re: risks of expectant management including infection, abruption, labor, or IUFD. Discussed moving forward with delivery if any of the above were noted. Otherwise, plan for delivery @ ~34 wga, possibly 35 wga depending on pt preference.  - latency abx, transitioned to PO today  # H/o cervical insufficiency:  - prior loss of twin gestation, cerclage placed this pregnancy @~13 wga - cerclage removed at bedside after small area of it pulling through at 8 o'clock and per MFM recs. Moderate bleeding that slowed after removal. CTM bleeding closely.   # A2GDM: - ctm BS per protocol, BS mostly above goal thus Insulin was uptitrated today - Lantus 6u QHS and SSI of Novolog 0-14 u QID - plan to have standing Novolog 3u TID once tol full diet (likely not until AM)  # FWB: BPP 8/10, reassuring - s/p Mag x 48 hours - s/p BMZ x 2  # ROD: for VTOLAC. Pt understands risks including failure, <2% risk of uterine rupture but if that occurs about 10% risk of neonatal morbidity/mortality. Desires to proceed with VTOLAC.   # GBS unknown. Given prematurity, would be for PCN intrapartum if in labor  # Dispo: transfer to [facility] given stability, ctm there closely  Belva AgeeElise Glynn Freas MD

## 2015-10-31 NOTE — Anesthesia Pain Management Evaluation Note (Signed)
  CRNA Pain Management Visit Note  Patient: Andrea Young, 40 y.o., female  "Hello I am a member of the anesthesia team at [facility]. We have an anesthesia team available at all times to provide care throughout the hospital, including epidural management and anesthesia for C-section. I don't know your plan for the delivery whether it a natural birth, water birth, IV sedation, nitrous supplementation, doula or epidural, but we want to meet your pain goals."   1.Was your pain managed to your expectations on prior hospitalizations?   No prior hospitalizations  2.What is your expectation for pain management during this hospitalization?     Epidural  3.How can we help you reach that goal? Explaining options for pain control  Record the patient's initial score and the patient's pain goal.   Pain: 1  Pain Goal: 5 [facility] wants you to be able to say your pain was always managed very well.  Tacora Athanas 10/31/2015

## 2015-10-31 NOTE — Progress Notes (Signed)
S: No complaints. Feels comf - no ctxn or Vb. Continues to leak clear fluid. No hypoglycemia symtpoms.  O:  Vitals  Vitals:   10/31/15 0800 10/31/15 0900  BP: (!) 111/58 120/64  Pulse: 82 73  Resp: 18 18  Temp: 98.6 F (37 C)    Pt is comf.  Sterile speculum exam performed. Cerclage noted to be pulling through at ~ 8 o'clock. Cerclage removed at bedside.  Moderate bleeding with removal. Sterile SVE afterwards: 1/3/-2, area where cerclage pulled through does not extend all the way through cervix - only superficially.   A/P: Pt is a G2P0100 here with PPROM, curring @ 32.3wga, now s/p removal of cerclage and being observed for s/s of labor.  # PPROM: no s/s of infection, abruption or labor. - Counseled re: risks of expectant management including infection, abruption, labor, or IUFD. Discussed moving forward with delivery if any of the above were noted. Otherwise, plan for delivery @ ~34 wga, possibly 35 wga depending on pt preference.  - latency abx, transition to PO today  # H/o cervical insufficiency:  - prior loss of twin gestation, cerclage placed this pregnancy @~13 wga - cerclage removed at bedside after small area of it pulling through at 8 o'clock and per MFM recs. Moderate bleeding that slowed after removal. CTM bleeding closely.   # A2GDM: - ctm BS per protocol - Lantus 6u QHS and SSI of Novolog 0-14 u QID - plan to have standing Novolog 3u TID once tol full diet  # FWB: cat 1 tracing - Mag for CP proph - plan to d/c if labor does not progress after cerclage removal - s/p BMZ x 2  # ROD: for VTOLAC. Pt understands risks including failure, <2% risk of uterine rupture but if that occurs about 10% risk of neonatal morbidity/mortality. Desires to proceed with VTOLAC.   # GBS unknown. Given prematurity, would be for PCN intrapartum if in labor  Belva AgeeElise Makenah Karas MD

## 2015-10-31 NOTE — Progress Notes (Signed)
SVE unchanged. Pt comf - no complaints. Toco quiet. FHT overall reassuring w/spontaneous accels however one decel x 2 min. Therefore, will ctm fetus w/cefm x2 hours or until BPP able to be performed. As long as reassuring, will transfer to Ap.    Belva AgeeElise Haivyn Oravec MD

## 2015-10-31 NOTE — Progress Notes (Signed)
Patient ID: Andrea Young, female   DOB: 1975/10/29, 40 y.o.   MRN: [id number]   Pt comf, toco quiet. Will therefore d/c Mag.  Plan to re-SVE 1-2 hours after stopping Mag. If SVE stable, transfer to Ap, gen diet.   In addition to previously laid out plan (see my earlier note), will add Fe for Hgb of 9.4 and colace for proph. 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   Belva AgeeElise Oria Klimas MD

## 2015-10-31 NOTE — Progress Notes (Signed)
Inpatient Diabetes Program Recommendations  Diabetes Treatment Program Recommendations  ADA Standards of Care 2016 Diabetes in Pregnancy Target Glucose Ranges:  Fasting: 60 - 90 mg/dL Preprandial: 60 - 161105 mg/dL 1 hr postprandial: Less than 140mg /dL (from first bite of meal) 2 hr postprandial: Less than 120 mg/dL (from first bite of meal)   Results for Massie MaroonHOLLIS, Blayre M (MRN [id number]) as of 10/31/2015 07:48  Ref. Range 10/30/2015 08:14 10/30/2015 10:18 10/30/2015 14:26 10/30/2015 20:20 10/31/2015 06:04  Glucose-Capillary Latest Ref Range: 65 - 99 mg/dL 811136 (H) 914203 (H) 782169 (H) 235 (H) 133 (H)   Review of Glycemic Control  Diabetes history: GDM Outpatient Diabetes medications: Lantus 8 units QHS Current orders for Inpatient glycemic control: Lantus 5 units QHS, Novolog 0-14 units QID (fasting and 2 hour post prandial)  Inpatient Diabetes Program Recommendations: Insulin - Basal: Please consider increasing Lantus to 6 units QHS. Insulin - Meal Coverage: Please consider ordering Novolog 3 units TID with meals for meal coverage if patient eats at least 50% of meal.  Thanks, Orlando PennerMarie Stori Royse, RN, MSN, CDE Diabetes Coordinator Inpatient Diabetes Program [phone] (Team Pager from 8am to 5pm) [phone] ([facility] office) [phone] [facility] office) [phone] [facility] office)

## 2015-11-01 LAB — GLUCOSE, CAPILLARY
GLUCOSE-CAPILLARY: 86 mg/dL (ref 65–99)
Glucose-Capillary: 116 mg/dL — ABNORMAL HIGH (ref 65–99)
Glucose-Capillary: 134 mg/dL — ABNORMAL HIGH (ref 65–99)
Glucose-Capillary: 128 mg/dL — ABNORMAL HIGH (ref 65–99)

## 2015-11-01 MED ORDER — POLYETHYLENE GLYCOL 3350 17 G PO PACK
17.0000 g | PACK | Freq: Every day | ORAL | Status: DC
  Administered 2015-11-01 – 2015-11-04 (×4): 17 g via ORAL
  Filled 2015-11-01 (×4): qty 1

## 2015-11-01 NOTE — Progress Notes (Signed)
I reconnected with pt after having met her during her pregnancy loss last year. I wanted to communicate my care and support and she was appreciative.  She is hopeful that all will go well with the remainder of this pregnancy.  She is aware of our ongoing availability for support and knows she can reach out if she wishes, but please also page as needs arise.  Chaplain Janne Napoleon, [place] Pager, [phone] 4:35 PM    11/01/15 1600  Clinical Encounter Type  Visited With Patient  Visit Type Follow-up;Spiritual support

## 2015-11-01 NOTE — Progress Notes (Addendum)
40 yo G2P0100 @ 32+4 wks w/ PPROM.  No ctx or vb.  Reports + FM.  No abdominal pain.  + leaking clear fluid C/o constipation  AF, VSS  + FHT Abd - fundus NT  A/P:   PPROM - no si/sx of chorio.  Continue inpatient mngt.  Continue latency abx.  S/p BMZ & magnesium  Incompetent cvx - cerclage removed 9/12.    A2DM - BG control better.  Lantus 6u QHS & SSI w/ meals.  Consider change to Novolog 3u TID if tolerates full diet today  Previous c-section - desires TOLAC

## 2015-11-02 LAB — TYPE AND SCREEN
ABO/RH(D): O POS
ANTIBODY SCREEN: NEGATIVE

## 2015-11-02 LAB — GLUCOSE, CAPILLARY
GLUCOSE-CAPILLARY: 80 mg/dL (ref 65–99)
GLUCOSE-CAPILLARY: 115 mg/dL — AB (ref 65–99)
Glucose-Capillary: 109 mg/dL — ABNORMAL HIGH (ref 65–99)
Glucose-Capillary: 160 mg/dL — ABNORMAL HIGH (ref 65–99)

## 2015-11-02 NOTE — Progress Notes (Signed)
Initial Nutrition Assessment  DOCUMENTATION CODES:   Not applicable  INTERVENTION:    Continue CHO modified gestational diet.  Allow snacks from the GDM snack list that was provided to patient.  NUTRITION DIAGNOSIS:   Increased nutrient needs related to other (see comment) (pregnancy) as evidenced by estimated needs.  GOAL:   Patient will meet greater than or equal to 90% of their needs  MONITOR:   PO intake  REASON FOR ASSESSMENT:   Gestational Diabetes    ASSESSMENT:   40 year old female admitted on 9/10 with PPROM. Just dx with GDM ~1 week PTA. Hx of cervical incompetence, cerclage placed at ~13 weeks and removed on 9/12.  Patient reports good intake. She has had diet education since diagnosis of GDM and has no questions. She has been receiving snacks occasionally. Provided GDM snack handout and reviewed snack options with patient.   Weight 210 lbs pre-pregnancy, BMI=32.9 234 lbs now 24 lb weight gain, goal weight gain 11-20 lbs  CBG's: [id number]  Diet Order:  Diet gestational carb mod Room service appropriate? Yes; Fluid consistency: Thin  Skin:  Reviewed, no issues  Height:   Ht Readings from Last 1 Encounters:  10/29/15 5\' 7"  (1.702 m)    Weight:   Wt Readings from Last 1 Encounters:  11/01/15 234 lb 14.4 oz (106.5 kg)   Pre-pregnancy BMI: 32.9 (obesity, class 1)  Estimated Nutritional Needs:   Kcal:  2000-2200  Protein:  75-85 gm  Fluid:  2-2.2 L  EDUCATION NEEDS:   Education needs addressed  Joaquin CourtsKimberly Harris, RD, LDN, CNSC Pager [phone] After Hours Pager [phone]

## 2015-11-02 NOTE — Progress Notes (Signed)
Patient is doing well.  She is staying strong. Baby is moving well. She reports leaking minimal amount of fluid.  BP 103/60 (BP Location: Right Arm)   Pulse 66   Temp 98 F (36.7 C) (Oral)   Resp 16   Ht 5\' 7"  (1.702 m)   Wt 106.5 kg (234 lb 14.4 oz)   BMI 36.79 kg/m  Abdomen is soft and non tender Results for orders placed or performed during the hospital encounter of 10/29/15 (from the past 24 hour(s))  Glucose, capillary     Status: Abnormal   Collection Time: 11/01/15 10:16 AM  Result Value Ref Range   Glucose-Capillary 116 (H) 65 - 99 mg/dL   Comment 1 Notify RN    Comment 2 Document in Chart   Glucose, capillary     Status: Abnormal   Collection Time: 11/01/15  4:29 PM  Result Value Ref Range   Glucose-Capillary 134 (H) 65 - 99 mg/dL   Comment 1 Notify RN    Comment 2 Document in Chart   Glucose, capillary     Status: Abnormal   Collection Time: 11/01/15 10:58 PM  Result Value Ref Range   Glucose-Capillary 128 (H) 65 - 99 mg/dL  Type and screen [facility]     Status: None   Collection Time: 11/02/15  7:34 AM  Result Value Ref Range   ABO/RH(D) O POS    Antibody Screen NEG    Sample Expiration 11/05/2015   Glucose, capillary     Status: None   Collection Time: 11/02/15  8:44 AM  Result Value Ref Range   Glucose-Capillary 80 65 - 99 mg/dL   Impression:  IUP at 32 w 5 days PPROM  History of cerclage - REMOVED 2 days ago A2 DM on insulin Previous C Section 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 Patient wants a trial of labor - will assess situation at time of delivery Deliver is chorioamnionitis develops

## 2015-11-03 LAB — CBC WITH DIFFERENTIAL/PLATELET
Basophils Absolute: 0 10*3/uL (ref 0.0–0.1)
Basophils Relative: 0 %
EOS ABS: 0 10*3/uL (ref 0.0–0.7)
Eosinophils Relative: 0 %
HCT: 28.6 % — ABNORMAL LOW (ref 36.0–46.0)
HEMOGLOBIN: 9.5 g/dL — AB (ref 12.0–15.0)
LYMPHS ABS: 1.3 10*3/uL (ref 0.7–4.0)
Lymphocytes Relative: 25 %
MCH: 27.1 pg (ref 26.0–34.0)
MCHC: 33.2 g/dL (ref 30.0–36.0)
MCV: 81.7 fL (ref 78.0–100.0)
Monocytes Absolute: 0.2 10*3/uL (ref 0.1–1.0)
Monocytes Relative: 5 %
NEUTROS PCT: 70 %
Neutro Abs: 3.7 10*3/uL (ref 1.7–7.7)
Platelets: 242 10*3/uL (ref 150–400)
RBC: 3.5 MIL/uL — AB (ref 3.87–5.11)
RDW: 14.5 % (ref 11.5–15.5)
WBC: 5.3 10*3/uL (ref 4.0–10.5)

## 2015-11-03 LAB — GLUCOSE, CAPILLARY
GLUCOSE-CAPILLARY: 121 mg/dL — AB (ref 65–99)
GLUCOSE-CAPILLARY: 128 mg/dL — AB (ref 65–99)
Glucose-Capillary: 120 mg/dL — ABNORMAL HIGH (ref 65–99)
Glucose-Capillary: 142 mg/dL — ABNORMAL HIGH (ref 65–99)

## 2015-11-03 NOTE — Progress Notes (Signed)
C/O new, mild LLQ pain.  Active FM.  Scant leakage of fluid.  No CTX.  No F/C.  VSS. AF. Blood sugars improved  Gen: A&O x 3 Abd: soft, mild ttp in llq Ext: no c/c/e FHT Cat I Toco flat  IUP at 3029w6d with PPROM -CBC today -s/p BMZ (2nd course) 9,10-11 -D6/7 latency abx -prev C/S-desires TOLAC -Deliver for chorio or 34 weeks -A2DM-insulin; improving glucose values (s/p BMZ)  Mitchel HonourMegan Shanitha Twining, DO

## 2015-11-03 NOTE — Progress Notes (Signed)
UR chart review completed.  

## 2015-11-04 LAB — GLUCOSE, CAPILLARY
GLUCOSE-CAPILLARY: 126 mg/dL — AB (ref 65–99)
GLUCOSE-CAPILLARY: 145 mg/dL — AB (ref 65–99)
Glucose-Capillary: 103 mg/dL — ABNORMAL HIGH (ref 65–99)
Glucose-Capillary: 123 mg/dL — ABNORMAL HIGH (ref 65–99)

## 2015-11-04 MED ORDER — SODIUM CHLORIDE 0.9% FLUSH
3.0000 mL | Freq: Three times a day (TID) | INTRAVENOUS | Status: DC
  Administered 2015-11-04: 3 mL via INTRAVENOUS

## 2015-11-04 NOTE — Progress Notes (Signed)
Active FM.  Scant leakage of fluid.  No CTX.  No F/C.    VSS. AF. Blood sugars stable WBC 5.4 yesterday with no shift  Gen: A&O x 3 Abd: soft, mild ttp in llq (unchanged from yesterday) Ext: no c/c/e FHT Cat I Toco flat  IUP at 185w0d with PPROM -s/p BMZ (2nd course) 9,10-11 -D7/7 latency abx -prev C/S-desires TOLAC -Deliver for chorio or 34 weeks -A2DM-insulin; improving glucose values (s/p BMZ)  Mitchel HonourMegan Clementina Mareno, DO

## 2015-11-05 ENCOUNTER — Encounter (HOSPITAL_COMMUNITY)
Admission: AD | Disposition: A | Discharge status: Home / Self Care | Payer: Self-pay | Source: Ambulatory Visit | Attending: Obstetrics and Gynecology

## 2015-11-05 ENCOUNTER — Encounter (HOSPITAL_COMMUNITY): Payer: Self-pay | Admitting: Anesthesiology

## 2015-11-05 ENCOUNTER — Inpatient Hospital Stay (HOSPITAL_COMMUNITY): Payer: 59 | Admitting: Anesthesiology

## 2015-11-05 ENCOUNTER — Inpatient Hospital Stay (HOSPITAL_COMMUNITY): Payer: 59

## 2015-11-05 DIAGNOSIS — O3433 Maternal care for cervical incompetence, third trimester: Secondary | ICD-10-CM | POA: Diagnosis not present

## 2015-11-05 DIAGNOSIS — Z23 Encounter for immunization: Secondary | ICD-10-CM | POA: Diagnosis not present

## 2015-11-05 DIAGNOSIS — Z6836 Body mass index (BMI) 36.0-36.9, adult: Secondary | ICD-10-CM | POA: Diagnosis not present

## 2015-11-05 DIAGNOSIS — O99214 Obesity complicating childbirth: Secondary | ICD-10-CM | POA: Diagnosis not present

## 2015-11-05 DIAGNOSIS — O24424 Gestational diabetes mellitus in childbirth, insulin controlled: Secondary | ICD-10-CM | POA: Diagnosis not present

## 2015-11-05 DIAGNOSIS — N96 Recurrent pregnancy loss: Secondary | ICD-10-CM | POA: Diagnosis not present

## 2015-11-05 DIAGNOSIS — O42913 Preterm premature rupture of membranes, unspecified as to length of time between rupture and onset of labor, third trimester: Secondary | ICD-10-CM | POA: Diagnosis not present

## 2015-11-05 LAB — GLUCOSE, CAPILLARY
GLUCOSE-CAPILLARY: 106 mg/dL — AB (ref 65–99)
GLUCOSE-CAPILLARY: 116 mg/dL — AB (ref 65–99)
Glucose-Capillary: 133 mg/dL — ABNORMAL HIGH (ref 65–99)
Glucose-Capillary: 85 mg/dL (ref 65–99)
Glucose-Capillary: 125 mg/dL — ABNORMAL HIGH (ref 65–99)

## 2015-11-05 LAB — CBC WITH DIFFERENTIAL/PLATELET
BASOS PCT: 0 %
Basophils Absolute: 0 10*3/uL (ref 0.0–0.1)
Eosinophils Absolute: 0 10*3/uL (ref 0.0–0.7)
Eosinophils Relative: 0 %
HEMATOCRIT: 31.3 % — AB (ref 36.0–46.0)
HEMOGLOBIN: 10.5 g/dL — AB (ref 12.0–15.0)
LYMPHS ABS: 1.3 10*3/uL (ref 0.7–4.0)
Lymphocytes Relative: 18 %
MCH: 28.1 pg (ref 26.0–34.0)
MCHC: 33.5 g/dL (ref 30.0–36.0)
MCV: 83.7 fL (ref 78.0–100.0)
MONO ABS: 0.7 10*3/uL (ref 0.1–1.0)
MONOS PCT: 10 %
NEUTROS ABS: 5 10*3/uL (ref 1.7–7.7)
NEUTROS PCT: 72 %
Platelets: 233 10*3/uL (ref 150–400)
RBC: 3.74 MIL/uL — ABNORMAL LOW (ref 3.87–5.11)
RDW: 14.5 % (ref 11.5–15.5)
WBC: 6.9 10*3/uL (ref 4.0–10.5)

## 2015-11-05 LAB — TYPE AND SCREEN
ABO/RH(D): O POS
ANTIBODY SCREEN: NEGATIVE

## 2015-11-05 SURGERY — Surgical Case
Anesthesia: Spinal | Wound class: Clean Contaminated

## 2015-11-05 MED ORDER — PHENYLEPHRINE 40 MCG/ML (10ML) SYRINGE FOR IV PUSH (FOR BLOOD PRESSURE SUPPORT)
PREFILLED_SYRINGE | INTRAVENOUS | Status: AC
  Filled 2015-11-05: qty 10

## 2015-11-05 MED ORDER — IBUPROFEN 600 MG PO TABS
600.0000 mg | ORAL_TABLET | Freq: Four times a day (QID) | ORAL | Status: DC
  Administered 2015-11-05 – 2015-11-08 (×11): 600 mg via ORAL
  Filled 2015-11-05 (×11): qty 1

## 2015-11-05 MED ORDER — NALBUPHINE HCL 10 MG/ML IJ SOLN: 5.0000 mg | INTRAMUSCULAR | Status: DC | PRN

## 2015-11-05 MED ORDER — WITCH HAZEL-GLYCERIN EX PADS: 1.0000 "application " | MEDICATED_PAD | CUTANEOUS | Status: DC | PRN

## 2015-11-05 MED ORDER — PRENATAL MULTIVITAMIN CH
1.0000 | ORAL_TABLET | Freq: Every day | ORAL | Status: DC
  Administered 2015-11-05 – 2015-11-08 (×4): 1 via ORAL
  Filled 2015-11-05 (×3): qty 1

## 2015-11-05 MED ORDER — MEPERIDINE HCL 25 MG/ML IJ SOLN: 6.2500 mg | INTRAMUSCULAR | Status: DC | PRN

## 2015-11-05 MED ORDER — KETOROLAC TROMETHAMINE 30 MG/ML IJ SOLN: 30.0000 mg | Freq: Four times a day (QID) | INTRAMUSCULAR | Status: DC | PRN

## 2015-11-05 MED ORDER — SOD CITRATE-CITRIC ACID 500-334 MG/5ML PO SOLN
ORAL | Status: AC
  Administered 2015-11-05: 30 mL
  Filled 2015-11-05: qty 15

## 2015-11-05 MED ORDER — OXYCODONE-ACETAMINOPHEN 5-325 MG PO TABS
1.0000 | ORAL_TABLET | ORAL | Status: DC | PRN
  Administered 2015-11-06: 1 via ORAL
  Filled 2015-11-05 (×2): qty 1

## 2015-11-05 MED ORDER — OXYCODONE-ACETAMINOPHEN 5-325 MG PO TABS: 2.0000 | ORAL_TABLET | ORAL | Status: DC | PRN

## 2015-11-05 MED ORDER — BUPIVACAINE IN DEXTROSE 0.75-8.25 % IT SOLN
INTRATHECAL | Status: DC | PRN
  Administered 2015-11-05: 1.6 mL via INTRATHECAL

## 2015-11-05 MED ORDER — SCOPOLAMINE 1 MG/3DAYS TD PT72
MEDICATED_PATCH | TRANSDERMAL | Status: DC | PRN
  Administered 2015-11-05: 1 via TRANSDERMAL

## 2015-11-05 MED ORDER — PROMETHAZINE HCL 25 MG/ML IJ SOLN
6.2500 mg | INTRAMUSCULAR | Status: DC | PRN
Start: 2015-11-05 — End: 2015-11-05

## 2015-11-05 MED ORDER — MENTHOL 3 MG MT LOZG: 1.0000 | LOZENGE | OROMUCOSAL | Status: DC | PRN

## 2015-11-05 MED ORDER — DEXTROSE 5 % IV SOLN
1.0000 ug/kg/h | INTRAVENOUS | Status: DC | PRN
  Filled 2015-11-05: qty 2

## 2015-11-05 MED ORDER — ONDANSETRON HCL 4 MG/2ML IJ SOLN
4.0000 mg | Freq: Three times a day (TID) | INTRAMUSCULAR | Status: DC | PRN
  Administered 2015-11-05: 4 mg via INTRAVENOUS
  Filled 2015-11-05: qty 2

## 2015-11-05 MED ORDER — MORPHINE SULFATE (PF) 0.5 MG/ML IJ SOLN
INTRAMUSCULAR | Status: DC | PRN
  Administered 2015-11-05: .2 mg via INTRATHECAL

## 2015-11-05 MED ORDER — LACTATED RINGERS IV SOLN
INTRAVENOUS | Status: DC | PRN
  Administered 2015-11-05: 04:00:00 via INTRAVENOUS

## 2015-11-05 MED ORDER — IBUPROFEN 600 MG PO TABS
600.0000 mg | ORAL_TABLET | Freq: Four times a day (QID) | ORAL | Status: DC | PRN
  Administered 2015-11-06: 600 mg via ORAL

## 2015-11-05 MED ORDER — DIPHENHYDRAMINE HCL 50 MG/ML IJ SOLN: 12.5000 mg | INTRAMUSCULAR | Status: DC | PRN

## 2015-11-05 MED ORDER — SIMETHICONE 80 MG PO CHEW
80.0000 mg | CHEWABLE_TABLET | ORAL | Status: DC
  Administered 2015-11-05 – 2015-11-07 (×3): 80 mg via ORAL
  Filled 2015-11-05 (×3): qty 1

## 2015-11-05 MED ORDER — KETOROLAC TROMETHAMINE 30 MG/ML IJ SOLN: 30.0000 mg | Freq: Once | INTRAMUSCULAR | Status: DC

## 2015-11-05 MED ORDER — OXYTOCIN 10 UNIT/ML IJ SOLN
INTRAMUSCULAR | Status: AC
Start: 2015-11-05 — End: 2015-11-05
  Filled 2015-11-05: qty 4

## 2015-11-05 MED ORDER — LACTATED RINGERS IV SOLN
INTRAVENOUS | Status: DC | PRN
  Administered 2015-11-05 (×2): via INTRAVENOUS

## 2015-11-05 MED ORDER — LACTATED RINGERS IV SOLN
INTRAVENOUS | Status: DC | PRN
  Administered 2015-11-05: 40 [IU] via INTRAVENOUS

## 2015-11-05 MED ORDER — ZOLPIDEM TARTRATE 5 MG PO TABS: 5.0000 mg | ORAL_TABLET | Freq: Every evening | ORAL | Status: DC | PRN

## 2015-11-05 MED ORDER — PHENYLEPHRINE 8 MG IN D5W 100 ML (0.08MG/ML) PREMIX OPTIME
INJECTION | INTRAVENOUS | Status: DC | PRN
  Administered 2015-11-05: 60 ug/min via INTRAVENOUS

## 2015-11-05 MED ORDER — ONDANSETRON HCL 4 MG/2ML IJ SOLN
INTRAMUSCULAR | Status: DC | PRN
  Administered 2015-11-05: 4 mg via INTRAVENOUS

## 2015-11-05 MED ORDER — NALOXONE HCL 0.4 MG/ML IJ SOLN
0.4000 mg | INTRAMUSCULAR | Status: DC | PRN
Start: 2015-11-05 — End: 2015-11-08

## 2015-11-05 MED ORDER — DIBUCAINE 1 % RE OINT: 1.0000 "application " | TOPICAL_OINTMENT | RECTAL | Status: DC | PRN

## 2015-11-05 MED ORDER — ONDANSETRON HCL 4 MG/2ML IJ SOLN
INTRAMUSCULAR | Status: AC
Start: 2015-11-05 — End: 2015-11-05
  Filled 2015-11-05: qty 2

## 2015-11-05 MED ORDER — LACTATED RINGERS IV BOLUS (SEPSIS)
500.0000 mL | INTRAVENOUS | Status: DC | PRN
  Administered 2015-11-05: 500 mL via INTRAVENOUS

## 2015-11-05 MED ORDER — DIPHENHYDRAMINE HCL 25 MG PO CAPS: 25.0000 mg | ORAL_CAPSULE | ORAL | Status: DC | PRN

## 2015-11-05 MED ORDER — PROMETHAZINE HCL 25 MG/ML IJ SOLN
12.5000 mg | Freq: Four times a day (QID) | INTRAMUSCULAR | Status: DC | PRN
Start: 2015-11-05 — End: 2015-11-08
  Administered 2015-11-05: 12.5 mg via INTRAVENOUS
  Filled 2015-11-05: qty 1

## 2015-11-05 MED ORDER — SCOPOLAMINE 1 MG/3DAYS TD PT72
MEDICATED_PATCH | TRANSDERMAL | Status: AC
  Filled 2015-11-05: qty 1

## 2015-11-05 MED ORDER — COCONUT OIL OIL
1.0000 "application " | TOPICAL_OIL | Status: DC | PRN
  Administered 2015-11-07: 1 via TOPICAL
  Filled 2015-11-05: qty 120

## 2015-11-05 MED ORDER — METRONIDAZOLE 0.75 % VA GEL
1.0000 | Freq: Every day | VAGINAL | Status: DC
Start: 2015-11-05 — End: 2015-11-08
  Administered 2015-11-05: 1 via VAGINAL
  Filled 2015-11-05: qty 70

## 2015-11-05 MED ORDER — ACETAMINOPHEN 500 MG PO TABS
1000.0000 mg | ORAL_TABLET | Freq: Four times a day (QID) | ORAL | Status: AC
  Administered 2015-11-05 (×3): 1000 mg via ORAL
  Filled 2015-11-05 (×3): qty 2

## 2015-11-05 MED ORDER — HYDROMORPHONE HCL 1 MG/ML IJ SOLN: 0.2500 mg | INTRAMUSCULAR | Status: DC | PRN

## 2015-11-05 MED ORDER — OXYTOCIN 40 UNITS IN LACTATED RINGERS INFUSION - SIMPLE MED: 2.5000 [IU]/h | INTRAVENOUS | Status: AC

## 2015-11-05 MED ORDER — NALBUPHINE HCL 10 MG/ML IJ SOLN: 5.0000 mg | Freq: Once | INTRAMUSCULAR | Status: DC | PRN

## 2015-11-05 MED ORDER — ACETAMINOPHEN 325 MG PO TABS: 650.0000 mg | ORAL_TABLET | ORAL | Status: DC | PRN

## 2015-11-05 MED ORDER — CLOTRIMAZOLE 1 % VA CREA
1.0000 | TOPICAL_CREAM | Freq: Every day | VAGINAL | Status: DC
  Filled 2015-11-05: qty 45

## 2015-11-05 MED ORDER — LACTATED RINGERS IV SOLN
INTRAVENOUS | Status: DC
Start: 2015-11-05 — End: 2015-11-07

## 2015-11-05 MED ORDER — SIMETHICONE 80 MG PO CHEW: 80.0000 mg | CHEWABLE_TABLET | ORAL | Status: DC | PRN

## 2015-11-05 MED ORDER — PHENYLEPHRINE HCL 10 MG/ML IJ SOLN
INTRAMUSCULAR | Status: DC | PRN
  Administered 2015-11-05: 120 ug via INTRAVENOUS

## 2015-11-05 MED ORDER — CEFAZOLIN SODIUM-DEXTROSE 2-4 GM/100ML-% IV SOLN
2.0000 g | Freq: Once | INTRAVENOUS | Status: AC
  Administered 2015-11-05: 2 g via INTRAVENOUS
  Filled 2015-11-05: qty 100

## 2015-11-05 MED ORDER — CEFAZOLIN SODIUM-DEXTROSE 2-4 GM/100ML-% IV SOLN
INTRAVENOUS | Status: AC
  Filled 2015-11-05: qty 100

## 2015-11-05 MED ORDER — SCOPOLAMINE 1 MG/3DAYS TD PT72
1.0000 | MEDICATED_PATCH | Freq: Once | TRANSDERMAL | Status: DC
  Filled 2015-11-05: qty 1

## 2015-11-05 MED ORDER — FENTANYL CITRATE (PF) 100 MCG/2ML IJ SOLN
INTRAMUSCULAR | Status: DC | PRN
  Administered 2015-11-05: 20 ug via INTRATHECAL

## 2015-11-05 MED ORDER — SIMETHICONE 80 MG PO CHEW
80.0000 mg | CHEWABLE_TABLET | Freq: Three times a day (TID) | ORAL | Status: DC
  Administered 2015-11-05 – 2015-11-08 (×7): 80 mg via ORAL
  Filled 2015-11-05 (×7): qty 1

## 2015-11-05 MED ORDER — SODIUM CHLORIDE 0.9% FLUSH
3.0000 mL | INTRAVENOUS | Status: DC | PRN
Start: 2015-11-05 — End: 2015-11-08

## 2015-11-05 MED ORDER — MORPHINE SULFATE-NACL 0.5-0.9 MG/ML-% IV SOSY
PREFILLED_SYRINGE | INTRAVENOUS | Status: AC
  Filled 2015-11-05: qty 1

## 2015-11-05 MED ORDER — SENNOSIDES-DOCUSATE SODIUM 8.6-50 MG PO TABS
2.0000 | ORAL_TABLET | ORAL | Status: DC
  Administered 2015-11-05 – 2015-11-07 (×2): 2 via ORAL
  Filled 2015-11-05 (×3): qty 2

## 2015-11-05 MED ORDER — FENTANYL CITRATE (PF) 100 MCG/2ML IJ SOLN
INTRAMUSCULAR | Status: AC
  Filled 2015-11-05: qty 2

## 2015-11-05 MED ORDER — PHENYLEPHRINE 8 MG IN D5W 100 ML (0.08MG/ML) PREMIX OPTIME
INJECTION | INTRAVENOUS | Status: AC
  Filled 2015-11-05: qty 100

## 2015-11-05 MED ORDER — DIPHENHYDRAMINE HCL 25 MG PO CAPS: 25.0000 mg | ORAL_CAPSULE | Freq: Four times a day (QID) | ORAL | Status: DC | PRN

## 2015-11-05 MED ORDER — TETANUS-DIPHTH-ACELL PERTUSSIS 5-2.5-18.5 LF-MCG/0.5 IM SUSP
0.5000 mL | Freq: Once | INTRAMUSCULAR | Status: AC
  Administered 2015-11-06: 0.5 mL via INTRAMUSCULAR
  Filled 2015-11-05: qty 0.5

## 2015-11-05 SURGICAL SUPPLY — 33 items
APL SKNCLS STERI-STRIP NONHPOA (GAUZE/BANDAGES/DRESSINGS) ×1
BENZOIN TINCTURE PRP APPL 2/3 (GAUZE/BANDAGES/DRESSINGS) ×3 IMPLANT
CHLORAPREP W/TINT 26ML (MISCELLANEOUS) ×3 IMPLANT
CLAMP CORD UMBIL (MISCELLANEOUS) IMPLANT
CLOSURE WOUND 1/2 X4 (GAUZE/BANDAGES/DRESSINGS) ×1
CLOTH BEACON ORANGE TIMEOUT ST (SAFETY) ×3 IMPLANT
DRSG OPSITE POSTOP 4X10 (GAUZE/BANDAGES/DRESSINGS) ×3 IMPLANT
ELECT REM PT RETURN 9FT ADLT (ELECTROSURGICAL) ×3
ELECTRODE REM PT RTRN 9FT ADLT (ELECTROSURGICAL) ×1 IMPLANT
EXTRACTOR VACUUM KIWI (MISCELLANEOUS) IMPLANT
GLOVE BIO SURGEON STRL SZ 6 (GLOVE) ×3 IMPLANT
GLOVE BIOGEL PI IND STRL 6 (GLOVE) ×2 IMPLANT
GLOVE BIOGEL PI IND STRL 7.0 (GLOVE) ×1 IMPLANT
GLOVE BIOGEL PI INDICATOR 6 (GLOVE) ×4
GLOVE BIOGEL PI INDICATOR 7.0 (GLOVE) ×2
GOWN STRL REUS W/TWL LRG LVL3 (GOWN DISPOSABLE) ×6 IMPLANT
KIT ABG SYR 3ML LUER SLIP (SYRINGE) ×3 IMPLANT
LIQUID BAND (GAUZE/BANDAGES/DRESSINGS) IMPLANT
NDL HYPO 25X5/8 SAFETYGLIDE (NEEDLE) ×1 IMPLANT
NEEDLE HYPO 25X5/8 SAFETYGLIDE (NEEDLE) ×3 IMPLANT
NS IRRIG 1000ML POUR BTL (IV SOLUTION) ×3 IMPLANT
PACK C SECTION WH (CUSTOM PROCEDURE TRAY) ×3 IMPLANT
PAD OB MATERNITY 4.3X12.25 (PERSONAL CARE ITEMS) ×3 IMPLANT
PENCIL SMOKE EVAC W/HOLSTER (ELECTROSURGICAL) ×3 IMPLANT
STRIP CLOSURE SKIN 1/2X4 (GAUZE/BANDAGES/DRESSINGS) ×1 IMPLANT
SUT CHROMIC 0 CTX 36 (SUTURE) ×9 IMPLANT
SUT MON AB 2-0 CT1 27 (SUTURE) ×3 IMPLANT
SUT PDS AB 0 CT1 27 (SUTURE) ×2 IMPLANT
SUT PLAIN 0 NONE (SUTURE) ×2 IMPLANT
SUT VIC AB 0 CT1 36 (SUTURE) IMPLANT
SUT VIC AB 4-0 KS 27 (SUTURE) ×2 IMPLANT
TOWEL OR 17X24 6PK STRL BLUE (TOWEL DISPOSABLE) ×3 IMPLANT
TRAY FOLEY CATH SILVER 14FR (SET/KITS/TRAYS/PACK) ×2 IMPLANT

## 2015-11-05 NOTE — Anesthesia Preprocedure Evaluation (Signed)
Anesthesia Evaluation  Patient identified by MRN, date of birth, ID band Patient awake    Reviewed: Allergy & Precautions, H&P , NPO status , Patient's Chart, lab work & pertinent test results  History of Anesthesia Complications (+) PONV  Airway Mallampati: II  TM Distance: >3 FB Neck ROM: full    Dental no notable dental hx.    Pulmonary neg pulmonary ROS,    Pulmonary exam normal        Cardiovascular negative cardio ROS Normal cardiovascular exam     Neuro/Psych negative neurological ROS  negative psych ROS   GI/Hepatic negative GI ROS, Neg liver ROS,   Endo/Other  negative endocrine ROSdiabetes  Renal/GU negative Renal ROS     Musculoskeletal   Abdominal (+) + obese,   Peds  Hematology   Anesthesia Other Findings   Reproductive/Obstetrics (+) Pregnancy                             Anesthesia Physical Anesthesia Plan  ASA: II and emergent  Anesthesia Plan: Spinal   Post-op Pain Management:    Induction:   Airway Management Planned:   Additional Equipment:   Intra-op Plan:   Post-operative Plan:   Informed Consent: I have reviewed the patients History and Physical, chart, labs and discussed the procedure including the risks, benefits and alternatives for the proposed anesthesia with the patient or authorized representative who has indicated his/her understanding and acceptance.     Plan Discussed with: CRNA and Surgeon  Anesthesia Plan Comments:         Anesthesia Quick Evaluation

## 2015-11-05 NOTE — Progress Notes (Signed)
FHT minimal variability noted, HR 150s'. Pt reposition to her lt side , LR 500 ml bolus administered. We will continue to monitor.

## 2015-11-05 NOTE — Lactation Note (Signed)
This note was copied from a baby's chart. Lactation Consultation Note  Patient Name: [name] ONGEX'BToday's Date: 11/05/2015   In to visit mom to set up pump and begin pumping, mom reports she is not feeling well and wants me to come back later. Will follow up later today.      Maternal Data    Feeding    LATCH Score/Interventions                      Lactation Tools Discussed/Used     Consult Status      Ed BlalockSharon S Kamare Caspers 11/05/2015, 12:19 PM

## 2015-11-05 NOTE — Op Note (Signed)
Andrea Young PROCEDURE DATE: 10/29/2015 - 11/05/2015  PREOPERATIVE DIAGNOSIS: Intrauterine pregnancy at  4657w1d weeks gestation with non-reassuring fetal monitoring, PPROM, previous C/S x 1, GDM  POSTOPERATIVE DIAGNOSIS: The same  PROCEDURE:  Repeat Low Transverse Cesarean Section  SURGEON:  Dr. Mitchel HonourMegan Leaha Cuervo  INDICATIONS: Andrea Young is a 40 y.o. G2P0100 at 7657w1d scheduled for cesarean section secondary to non-reassuring fetal monitoring.  The patient was admitted one week ago for PPROM and received BMZ and latency antibiotics.  Tonight, the patient complained of feeling flu-like and CEFM was initiated.  Minimal variability and two spontaneous decelerations were noted.  BPP was performed and no fetal movement was noted over the 20 minute assessment.  I counseled the patient for urgent C/S.  The risks of cesarean section discussed with the patient included but were not limited to: bleeding which may require transfusion or reoperation; infection which may require antibiotics; injury to bowel, bladder, ureters or other surrounding organs; injury to the fetus; need for additional procedures including hysterectomy in the event of a life-threatening hemorrhage; placental abnormalities wth subsequent pregnancies, incisional problems, thromboembolic phenomenon and other postoperative/anesthesia complications. The patient concurred with the proposed plan, giving informed written consent for the procedure.    FINDINGS:  Viable female infant in cephalic presentation, APGARs 7,8:  Weight pending.  Clear amniotic fluid.  Intact placenta, three vessel cord.  Grossly normal uterus, ovaries and fallopian tubes. .   ANESTHESIA: Spinal ESTIMATED BLOOD LOSS: 600 ml SPECIMENS: Placenta sent to pathology COMPLICATIONS: None immediate  PROCEDURE IN DETAIL:  The patient received intravenous antibiotics and had sequential compression devices applied to her lower extremities while in the preoperative area.  She was  then taken to the operating room where spinal anesthesia was administered and was found to be adequate. She was then placed in a dorsal supine position with a leftward tilt, and prepped and draped in a sterile manner.  A foley catheter was placed into her bladder and attached to constant gravity.  After an adequate timeout was performed, a Pfannenstiel skin incision was made with scalpel and carried through to the underlying layer of fascia. The fascia was incised in the midline and this incision was extended bilaterally using the Mayo scissors. Kocher clamps were applied to the superior aspect of the fascial incision and the underlying rectus muscles were dissected off bluntly. A similar process was carried out on the inferior aspect of the facial incision. The rectus muscles were separated in the midline bluntly and the peritoneum was entered bluntly.   A transverse hysterotomy was made with a scalpel and extended bilaterally bluntly. The bladder blade was then removed. The infant was successfully delivered, and cord was clamped and cut and infant was handed over to awaiting neonatology team. Uterine massage was then administered and the placenta delivered intact with three-vessel cord. The uterus was cleared of clot and debris.  The hysterotomy was closed with 0 chromic.  A second imbricating suture of 0-chromic was used to reinforce the incision and aid in hemostasis.  The peritoneum and rectus muscles were noted to be hemostatic and were reapproximated using 2-0 monocryl in a running fashion.  The fascia was closed with 0-PDS in a running fashion with good restoration of anatomy.  The subcutaneus tissue was copiously irrigated.  The skin was closed with 4-0 monocryl in a subcuticular fashion.  Pt tolerated the procedure will.  All counts were correct x2.  Pt went to the recovery room in stable condition.

## 2015-11-05 NOTE — Progress Notes (Signed)
Subjective: Postpartum Day 0: Cesarean Delivery Patient reports tolerating PO.    Objective: Vital signs in last 24 hours: Temp:  [97.2 F (36.2 C)-98.7 F (37.1 C)] 97.7 F (36.5 C) (09/17 0705) Pulse Rate:  [57-96] 65 (09/17 0705) Resp:  [11-20] 20 (09/17 0705) BP: (90-129)/(54-81) 109/64 (09/17 0705) SpO2:  [98 %-100 %] 100 % (09/17 0705)  Physical Exam:  General: alert, cooperative and appears stated age Lochia: appropriate Uterine Fundus: firm Incision: healing well, no significant drainage, no dehiscence DVT Evaluation: No evidence of DVT seen on physical exam. Negative Homan's sign. No cords or calf tenderness.   Recent Labs  11/03/15 1013 11/05/15 0110  HGB 9.5* 10.5*  HCT 28.6* 31.3*    Assessment/Plan: Status post Cesarean section. Doing well postoperatively.  Continue current care.  Andrea Young 11/05/2015, 8:21 AM

## 2015-11-05 NOTE — Anesthesia Postprocedure Evaluation (Signed)
Anesthesia Post Note  Patient: Andrea Young  Procedure(s) Performed: Procedure(s) (LRB): CESAREAN SECTION (N/A)  Patient location during evaluation: PACU Anesthesia Type: Spinal Level of consciousness: awake Pain management: pain level controlled Vital Signs Assessment: post-procedure vital signs reviewed and stable Respiratory status: spontaneous breathing Cardiovascular status: stable Postop Assessment: no headache, no backache, spinal receding, patient able to bend at knees and no signs of nausea or vomiting Anesthetic complications: no     Last Vitals:  Vitals:   11/05/15 0445 11/05/15 0446  BP: 107/67   Pulse: 69 65  Resp: 13 12  Temp:      Last Pain:  Vitals:   11/05/15 0208  TempSrc: Oral  PainSc:    Pain Goal: Patients Stated Pain Goal: 3 (11/01/15 2006)               Tykiera Raven JR,JOHN Susann GivensFRANKLIN

## 2015-11-05 NOTE — Progress Notes (Signed)
Patient c/o feeling achy and "flu-like."  She was placed on the monitor and despite repositioning, there was minimal variability and spontaneous decelerations x 2.  BPP was immediately performed and I was in the room to see no movement for 15 minutes.  I informed the patient that we needed to proceed to urgent C/S for non-reassuring monitoring.  She in informed of the risk of bleeding, infection, scarring and damage to surrounding structures.  She understands the 1% risk of uterine ruptures in subsequent pregnancies.  All questions were answered and the patient is ready to proceed.  Mitchel HonourMegan Kawana Hegel, DO

## 2015-11-05 NOTE — Progress Notes (Signed)
After fluid bolus and position change, fetal heart rate still reassuring. Per MD, patient to have bedside BPP at this time. Will continue to monitor. Milon DikesKristina D Jesse Nosbisch, RN

## 2015-11-05 NOTE — Lactation Note (Signed)
This note was copied from a baby's chart. Lactation Consultation Note  Patient Name: Andrea Julianne HandlerLachina Hardage ZOXWR'UToday's Date: 11/05/2015 Reason for consult: Initial assessment;NICU baby;Infant < 6lbs   Follow up with mom of 11 hour old NICU infant born at 33w 1 d GA weighing 4 lb 2.7 oz. Mom with a history of a 23 week infant who lived for 8 days. Mom pumped for first infant. Mom is noted to have PCOS.   Mom informed LC she was ready to pump. Kim RN started mom pumping with DEBP. Mom was finishing pumping when I went into the room. She was noted to have a few drops in the pump on from the right breast. Milk was placed in a colostrum container, showed mom how to hand express and we received several more gtts of colostrum that was collected in a colostrum collection container, labeled and dad took to NICU.   Parents were shown how to set up pump, assemble and disassemble pump parts and clean pump parts after every pumping. Mom was informed of pumps in NICU. Mom has a PIS at home for use. Encouraged her to think about renting a hospital grade pump for the first 2 weeks to establish supply.   Hand Expression Handout, BF Resources and Providing Milk for Your Baby in NICU Booklet given, Enc mom to pump 8-12 x in 24 hours on Initiate setting with DEBP for 15 minutes followed by hand expression. Enc mom to allow a 4-5 hour stretch at night to rest. Colostrum containers, # stickers, BM Labels and Storage bottles given. Discussed Breast Milk storage for infant in NICU.   Enc mom to call for assistance as needed. Follow up tomorrow and prn.      Maternal Data Formula Feeding for Exclusion: No Has patient been taught Hand Expression?: Yes Does the patient have breastfeeding experience prior to this delivery?: Yes  Feeding    LATCH Score/Interventions                      Lactation Tools Discussed/Used WIC Program: No Pump Review: Setup, frequency, and cleaning;Milk Storage Initiated by::  Noralee StainSharon Avilene Marrin RN, IBCLC/ Kim RN Date initiated:: 11/05/15   Consult Status Consult Status: Follow-up Date: 11/06/15 Follow-up type: In-patient    Silas FloodSharon S Sorina Derrig 11/05/2015, 3:18 PM

## 2015-11-05 NOTE — Progress Notes (Signed)
   11/05/15 0045  Vitals  Temp 98.7 F (37.1 C)  Temp Source Oral  BP 129/81  BP Location Right Arm  BP Method Automatic  Patient Position (if appropriate) Lying  Pulse Rate 96  Resp 18  Oxygen Therapy  SpO2 100 %  O2 Device Room Air  Pt C/O sudden feeling of chills and body aches and pains. CBG 106mg /dl.Denies abdominal contraction or vaginal leakage. Dr Langston MaskerMorris called and informed. Order received for CBC with diff. Continue monitoring pt and call MD if there are any changes. FHM and TOCO applied.

## 2015-11-05 NOTE — Anesthesia Procedure Notes (Signed)
Spinal  Patient location during procedure: OR Start time: 11/05/2015 3:12 AM End time: 11/05/2015 3:15 AM Staffing Anesthesiologist: Leilani AbleHATCHETT, Nasim Garofano Performed: anesthesiologist  Preanesthetic Checklist Completed: patient identified, surgical consent, pre-op evaluation, timeout performed, IV checked, risks and benefits discussed and monitors and equipment checked Spinal Block Patient position: sitting Prep: site prepped and draped and DuraPrep Patient monitoring: heart rate, cardiac monitor, continuous pulse ox and blood pressure Approach: midline Location: L3-4 Injection technique: single-shot Needle Needle type: Sprotte  Needle gauge: 24 G Needle length: 9 cm Assessment Sensory level: T4

## 2015-11-05 NOTE — Progress Notes (Signed)
Patient off fetal monitor at this time. Ultrasound at bedside. Will continue to monitor. Milon DikesKristina D Maxxon Schwanke, RN

## 2015-11-05 NOTE — Progress Notes (Signed)
UR chart review completed.  

## 2015-11-05 NOTE — Transfer of Care (Signed)
Immediate Anesthesia Transfer of Care Note  Patient: Andrea MaroonLachina M Alkins  Procedure(s) Performed: Procedure(s): CESAREAN SECTION (N/A)  Patient Location: PACU  Anesthesia Type:Spinal  Level of Consciousness: awake, alert  and oriented  Airway & Oxygen Therapy: Patient Spontanous Breathing  Post-op Assessment: Report given to RN and Post -op Vital signs reviewed and stable  Post vital signs: Reviewed and stable  Last Vitals:  Vitals:   11/05/15 0045 11/05/15 0208  BP: 129/81 (!) 103/56  Pulse: 96 77  Resp: 18   Temp: 37.1 C 36.9 C    Last Pain:  Vitals:   11/05/15 0208  TempSrc: Oral  PainSc:       Patients Stated Pain Goal: 3 (11/01/15 2006)  Complications: No apparent anesthesia complications

## 2015-11-06 ENCOUNTER — Encounter (HOSPITAL_COMMUNITY): Payer: Self-pay | Admitting: Obstetrics & Gynecology

## 2015-11-06 LAB — CBC
HEMATOCRIT: 27.7 % — AB (ref 36.0–46.0)
HEMOGLOBIN: 9.2 g/dL — AB (ref 12.0–15.0)
MCH: 27.2 pg (ref 26.0–34.0)
MCHC: 33.2 g/dL (ref 30.0–36.0)
MCV: 82 fL (ref 78.0–100.0)
Platelets: 216 10*3/uL (ref 150–400)
RBC: 3.38 MIL/uL — AB (ref 3.87–5.11)
RDW: 14.9 % (ref 11.5–15.5)
WBC: 8.8 10*3/uL (ref 4.0–10.5)

## 2015-11-06 LAB — RPR: RPR: NONREACTIVE

## 2015-11-06 LAB — GLUCOSE, CAPILLARY
GLUCOSE-CAPILLARY: 116 mg/dL — AB (ref 65–99)
Glucose-Capillary: 94 mg/dL (ref 65–99)

## 2015-11-06 NOTE — Progress Notes (Signed)
Subjective: Postpartum Day 1: Cesarean Delivery Patient reports tolerating PO, + flatus and no problems voiding.   Baby stable in NICU Objective: Vital signs in last 24 hours: Temp:  [98 F (36.7 C)-98.5 F (36.9 C)] 98.3 F (36.8 C) (09/18 0509) Pulse Rate:  [58-83] 70 (09/18 0509) Resp:  [16-18] 16 (09/18 0509) BP: (90-117)/(54-75) 90/58 (09/18 0509) SpO2:  [98 %-100 %] 100 % (09/18 0509)  Physical Exam:  General: alert and cooperative Lochia: appropriate Uterine Fundus: firm Incision: healing well DVT Evaluation: No evidence of DVT seen on physical exam. Negative Homan's sign. No cords or calf tenderness. No significant calf/ankle edema.   Recent Labs  11/05/15 0110 11/06/15 0505  HGB 10.5* 9.2*  HCT 31.3* 27.7*    Assessment/Plan: Status post Cesarean section. Doing well postoperatively.  Continue current care.  CURTIS,CAROL G 11/06/2015, 8:40 AM

## 2015-11-06 NOTE — Plan of Care (Signed)
Problem: Health Behavior/Discharge Planning: Goal: Ability to manage health-related needs will improve Outcome: Completed/Met Date Met: 11/06/15 Patient is aware of how to manage her care.

## 2015-11-06 NOTE — Plan of Care (Signed)
Problem: Life Cycle: Goal: Risk for postpartum hemorrhage will decrease Outcome: Completed/Met Date Met: 11/06/15 Vaginal bleeding small in amount.Patient aware needs to call MD if bleeding exceeds amt she has had in the hospital. Goal: Chance of risk for complications during the postpartum period will decrease Outcome: Completed/Met Date Met: 11/06/15 Walks in hall I/S up to 2000 Wears SCD's Passing flatus Small vaginal bleeding VSS  Problem: Role Relationship: Goal: Ability to demonstrate positive interaction with newborn will improve Outcome: Completed/Met Date Met: 11/06/15 Visits NICU with Husband frequently.  Problem: Respiratory: Goal: Ability to maintain adequate ventilation will improve Outcome: Completed/Met Date Met: 11/06/15 No SOB,or DOE.I/S 2000

## 2015-11-06 NOTE — Lactation Note (Signed)
This note was copied from a baby's chart. Lactation Consultation Note  Patient Name: Andrea Young RUEAV'WToday's Date: 11/06/2015 Reason for consult: Follow-up assessment   Follow up at infant's bedside while mom holding infant STS. Mom reports she is pumping and getting small volumes of Colostrum Enc her to continue pumping followed by hand expression at least 8 times a day. Reviewed supply and demand and milk coming to volume. Mom report she slept last night, enc her to pump more often during the day to allow for sleeping up to 6 hours at night. Mom voiced understanding. Colostrum Collection containers left at mom's bedside. Follow up tomorrow and prn.      Maternal Data Formula Feeding for Exclusion: No Has patient been taught Hand Expression?: Yes Does the patient have breastfeeding experience prior to this delivery?: Yes  Feeding    LATCH Score/Interventions                      Lactation Tools Discussed/Used Pump Review: Setup, frequency, and cleaning   Consult Status Consult Status: Follow-up Date: 11/07/15 Follow-up type: In-patient    [name] 11/06/2015, 1:25 PM

## 2015-11-07 LAB — GLUCOSE, CAPILLARY
GLUCOSE-CAPILLARY: 120 mg/dL — AB (ref 65–99)
GLUCOSE-CAPILLARY: 103 mg/dL — AB (ref 65–99)
GLUCOSE-CAPILLARY: 184 mg/dL — AB (ref 65–99)
Glucose-Capillary: 101 mg/dL — ABNORMAL HIGH (ref 65–99)

## 2015-11-07 NOTE — Lactation Note (Signed)
This note was copied from a baby's chart. Lactation Consultation Note  Patient Name: Andrea Young ZOXWR'UToday's Date: 11/07/2015 Reason for consult: Follow-up assessment;NICU baby   NICU baby 6859 hours old. Mom pumping when this LC entered the room. Mom reports that she has just returned from visiting baby in NICU. Mom states that she is pumping about 2-3 ml of colostrum at this time. Discussed progression of milk coming to volume with mom. Mom reports that her first child, born last year, was a 23-weeks GA and lived 8 days in NICU. Mom reports that she pumped and had lots of EBM. Mom states that she has her personal DEBP from first baby, but needs to find all the parts. Enc mom to continue pumping every 2-3 hours for a total of 8 times/24 hours.      Maternal Data    Feeding    LATCH Score/Interventions                      Lactation Tools Discussed/Used     Consult Status Consult Status: Follow-up Date: 11/08/15 Follow-up type: In-patient    Sherlyn HayJennifer D Kimbria Camposano 11/07/2015, 3:12 PM

## 2015-11-07 NOTE — Progress Notes (Signed)
CSW attempted to meet with MOB to offer support and complete assessment due to baby's admission to NICU, but she was not in her room at this time.  CSW will attempt again at a later time. 

## 2015-11-07 NOTE — Progress Notes (Signed)
Pt did not want blood sugar taken with afternoon meal   At 1600  Spent a lot of time in nicu

## 2015-11-07 NOTE — Progress Notes (Signed)
Subjective: Postpartum Day 2: Cesarean Delivery Patient reports tolerating PO, + flatus and no problems voiding.   Baby continues stable in NICU Objective: Vital signs in last 24 hours: Temp:  [98.1 F (36.7 C)-98.6 F (37 C)] 98.2 F (36.8 C) (09/19 0609) Pulse Rate:  [65-96] 82 (09/19 0609) Resp:  [16-18] 16 (09/19 0609) BP: (89-109)/(47-60) 89/47 (09/19 0609) SpO2:  [100 %] 100 % (09/19 0609)  Physical Exam:  General: alert and cooperative Lochia: appropriate Uterine Fundus: firm Incision: healing well DVT Evaluation: No evidence of DVT seen on physical exam. Negative Homan's sign. No cords or calf tenderness. No significant calf/ankle edema.   Recent Labs  11/05/15 0110 11/06/15 0505  HGB 10.5* 9.2*  HCT 31.3* 27.7*    Assessment/Plan: Status post Cesarean section. Doing well postoperatively.  Continue current care.  CURTIS,CAROL G 11/07/2015, 9:03 AM

## 2015-11-07 NOTE — Anesthesia Postprocedure Evaluation (Signed)
Anesthesia Post Note  Patient: Andrea Young  Procedure(s) Performed: Procedure(s) (LRB): CESAREAN SECTION (N/A)  Patient location during evaluation: Mother Baby Anesthesia Type: Spinal Level of consciousness: patient remains intubated per anesthesia plan Pain management: satisfactory to patient Vital Signs Assessment: post-procedure vital signs reviewed and stable Respiratory status: spontaneous breathing Cardiovascular status: stable Anesthetic complications: no     Last Vitals:  Vitals:   11/06/15 2215 11/07/15 0609  BP: (!) 101/57 (!) 89/47  Pulse: 96 82  Resp: 18 16  Temp: 37 C 36.8 C    Last Pain:  Vitals:   11/07/15 0725  TempSrc:   PainSc: 2    Pain Goal: Patients Stated Pain Goal: 4 (11/07/15 0631)               Cephus ShellingBURGER,Miles Borkowski

## 2015-11-07 NOTE — Addendum Note (Signed)
Addendum  created 11/07/15 0750 by Algis GreenhouseLinda A Weylin Plagge, CRNA   Charge Capture section accepted

## 2015-11-07 NOTE — Addendum Note (Signed)
Addendum  created 11/07/15 0749 by Algis GreenhouseLinda A Ambriella Kitt, CRNA   Sign clinical note

## 2015-11-08 MED ORDER — IBUPROFEN 600 MG PO TABS
600.0000 mg | ORAL_TABLET | Freq: Four times a day (QID) | ORAL | 0 refills | Status: DC
Start: 2015-11-08 — End: 2021-10-24

## 2015-11-08 NOTE — Progress Notes (Signed)
Pt teaching complete  Ambulated out plan to return later to nicu

## 2015-11-08 NOTE — Discharge Summary (Signed)
Obstetric Discharge Summary Reason for Admission: rupture of membranes Prenatal Procedures: cerclage, NST and ultrasound Intrapartum Procedures: cesarean: low cervical, transverse Postpartum Procedures: none Complications-Operative and Postpartum: none Hemoglobin  Date Value Ref Range Status  11/06/2015 9.2 (L) 12.0 - 15.0 g/dL Final   HCT  Date Value Ref Range Status  11/06/2015 27.7 (L) 36.0 - 46.0 % Final    Physical Exam:  General: alert and cooperative Lochia: appropriate Uterine Fundus: firm Incision: healing well, honeycomb dressing removed and replaced DVT Evaluation: No evidence of DVT seen on physical exam. Negative Homan's sign. No cords or calf tenderness. No significant calf/ankle edema.  Discharge Diagnoses:  s/p cesarean delivery @31  weeks  Discharge Information: Date: 11/08/2015 Activity:  Up as tolerated Diet: routine Medications: PNV and Ibuprofen Condition: stable Instructions: refer to practice specific booklet Discharge to: home   Newborn Data: Live born female  Birth Weight: 4 lb 2.7 oz (1890 g) APGAR: 7, 8  Baby in NICU  Edwards Mckelvie G 11/08/2015, 9:02 AM

## 2015-11-08 NOTE — Clinical Social Work Maternal (Signed)
CLINICAL SOCIAL WORK MATERNAL/CHILD NOTE  Patient Details  Name: Andrea Young MRN: [id number] Date of Birth: [birth year]  Date:  11/08/2015  Clinical Social Worker Initiating Note:  Osmel Dykstra E. Brigitte Pulse, [place] Date/ Time Initiated:  11/08/15/1020     Child's Name:  Andrea Young   Legal Guardian:   (Parents: [place] and Andrea Young)   Need for Interpreter:  None   Date of Referral:   (No referral-NICU admission)     Reason for Referral:      Referral Source:      Address:  [address] [place], [place] [address]  Phone number:  [phone]   Household Members:  Spouse   Natural Supports (not living in the home):  Immediate Family, Extended Family, Church, Friends   Professional Supports: Transport planner (MOB states she had grief counseling after the loss of her twins last year and can return if needed.)   Employment: Full-time   Type of Work: 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   Education:      Pensions consultant:  Multimedia programmer   Other Resources:      Cultural/Religious Considerations Which May Impact Care: MOB's facesheet notes religion as Engineer, manufacturing.  MOB states her church family is a huge support system.  Strengths:  Ability to meet basic needs , Compliance with medical plan , Understanding of illness (CSW did not discuss pediatrician and home preparedness at this time.)   Risk Factors/Current Problems:      Cognitive State:  Able to Concentrate , Alert , Goal Oriented , Insightful , Linear Thinking    Mood/Affect:  Comfortable , Calm , Interested , Tearful , Relaxed    CSW Assessment: CSW met with MOB in her third floor room to introduce services, offer support and complete assessment due to baby's admission to NICU at 33.1 weeks.  CSW is familiar with patient due to her loss of twins in 2016, however, did not  mention this at this time.  MOB was pleasant and welcoming of CSW's visit.  CSW found MOB to be easy to engage and open to talking about her feelings. MOB reports that Olen Cordial is doing well.  She explained that she has been on bedrest since July 3rd at home and that on Sunday she started feeling very ill and it was decided that she should deliver.  She reports that the c-section went well and that she is feeling well physically.  MOB reports no questions or concerns regarding baby's condition/care in NICU thus far.  MOB did not initially mention her history of loss, but then told CSW that this pregnancy was wonderful compared to her last.  CSW asked how she felt when she entered the NICU again and how she is coping with being here again.  MOB replied, "there are a lot of triggers."  CSW acknowledged MOB's history of loss and provided supportive brief counseling as MOB told her story and processed feelings regarding her loss and current NICU situation.  CSW normalized and validated MOB's feelings.  CSW encouraged MOB to separate her thoughts regarding her first NICU experience and this one.  CSW also encouraged MOB to allow herself to grieve her twins all over again as this experience will bring those emotions back to the surface, acknowledging also that there has not been much time since her loss.  CSW  encouraged her to talk with her supports about how she is feeling.  CSW inquired about what support she had after her loss.  MOB explained that she and her husband sought grief counseling at an office in [facility], to which she cannot recall the name.  She states she found this beneficial to a point and then decided she just didn't want to talk about it anymore and wanted to move on.  She states she can return to this counselor if she feels it is necessary at any time.  CSW provided education regarding perinatal mood disorders and encouraged MOB to talk with CSW, OB and or her former counselor if she has concerns about  her emotional health at any time.  MOB agreed. MOB then told her story of conception and how she feels Olen Cordial is her "miracle baby."  She seems grateful for the encouragement of her doctors to pursue another pregnancy and for the support they have provided along the way.  MOB explained that after implanting 2 embryos with one attaching, they initially thought Olen Cordial was an ectopic pregnancy, which was extremely traumatic.  She states she and her husband planned a trip to get away, but first had one more ultrasound at the recommendation of her doctor and found Olen Cordial to be implanted in her uterus with a strong heartbeat.  MOB was tearful as she spoke about her twin girls and about the miracle of this pregnancy.  She is pleased with how well Olen Cordial is doing and seems to have the ability to compartmentalize her experiences and feelings.  CSW thanked her for sharing her story and explained ongoing support services offered by NICU CSW.  CSW provided contact information and asked that MOB call any time.  MOB seemed appreciative. MOB then stated that she is feeling overwhelmed by the support she is being given by her family and church.  Her family [place], [place] and reports that they visit often.  She states her husband's family also lives there, but don't visit as much.  She states her sister has arranged for church members to bring food and visit with her.  She states she has found this emotionally draining, but is "non-confrontational" and does not know how to ask her sister to stop.  CSW encouraged her to think about what would be helpful and ask her family to do these things as opposed to telling the not to do the current things they are doing.  CSW also provided encouragement and empowerment to advocate for herself so that she can remain healthy in the postpartum period for the sake of herself, her husband and her baby.  CSW suggests creating a "[facility]" page as a way to update family and friends all at  once.  MOB seemed appreciative of the discussion.   CSW Plan/Description:  Patient/Family Education , Psychosocial Support and Ongoing Assessment of Needs    Alphonzo Cruise, [place] 11/08/2015, 11:17 AM

## 2015-11-14 DIAGNOSIS — N979 Female infertility, unspecified: Secondary | ICD-10-CM | POA: Diagnosis not present

## 2015-11-16 ENCOUNTER — Ambulatory Visit (HOSPITAL_COMMUNITY)
Admission: EM | Admit: 2015-11-16 | Discharge: 2015-11-16 | Disposition: A | Discharge status: Home / Self Care | Payer: 59 | Attending: Emergency Medicine | Admitting: Emergency Medicine

## 2015-11-16 ENCOUNTER — Encounter (HOSPITAL_COMMUNITY): Payer: Self-pay | Admitting: Emergency Medicine

## 2015-11-16 DIAGNOSIS — J029 Acute pharyngitis, unspecified: Secondary | ICD-10-CM | POA: Insufficient documentation

## 2015-11-16 LAB — POCT RAPID STREP A: Streptococcus, Group A Screen (Direct): NEGATIVE

## 2015-11-16 MED ORDER — PENICILLIN V POTASSIUM 500 MG PO TABS: 500.0000 mg | ORAL_TABLET | Freq: Two times a day (BID) | ORAL | 0 refills | Status: AC

## 2015-11-16 NOTE — ED Triage Notes (Signed)
Pt is here for ST onset 24 hours associated w/HA and odynophagia  Reports husband tested pos for tx  A&O x4... NAD

## 2015-11-16 NOTE — ED Provider Notes (Signed)
CSN: [id number]     Arrival date & time 11/16/15  1729 History   First MD Initiated Contact with Patient 11/16/15 1857     Chief Complaint  Patient presents with  . Sore Throat   (Consider location/radiation/quality/duration/timing/severity/associated sxs/prior Treatment) 40 year old female presents with sore throat for 1 day. Also having slight headache. No fever, nasal congestion, coughing, nausea or vomiting. Husband has similar symptoms and just tested positive for strep today. They have a premature infant in NICU and concerned over spreading any infection to him. She has taken Ibuprofen with some relief and is currently breast-feeding.    The history is provided by the patient.  Sore Throat  This is a new problem. The current episode started yesterday. The problem occurs constantly. The problem has not changed since onset.Associated symptoms include headaches. Pertinent negatives include no chest pain, no abdominal pain and no shortness of breath. The symptoms are aggravated by swallowing. The symptoms are relieved by NSAIDs. She has tried rest for the symptoms. The treatment provided mild relief.    Past Medical History:  Diagnosis Date  . Anemia    history  . Complication of anesthesia   . Gestational diabetes   . PCOS (polycystic ovarian syndrome)    treatment with metformin  . PONV (postoperative nausea and vomiting)    Past Surgical History:  Procedure Laterality Date  . bilateral foot surgery    . CERVICAL CERCLAGE N/A 06/19/2015   Procedure: CERCLAGE CERVICAL;  Surgeon: Marcelle Overlie, MD;  Location: [facility];  Service: Gynecology;  Laterality: N/A;  . CESAREAN SECTION N/A 09/26/2014   Procedure: CESAREAN SECTION;  Surgeon: Marcelle Overlie, MD;  Location: [facility];  Service: Obstetrics;  Laterality: N/A;  . CESAREAN SECTION N/A 11/05/2015   Procedure: CESAREAN SECTION;  Surgeon: Mitchel Honour, DO;  Location: [facility];  Service: Obstetrics;  Laterality: N/A;  .  DILATATION & CURRETTAGE/HYSTEROSCOPY WITH RESECTOCOPE  02/03/2012   Procedure: DILATATION & CURETTAGE/HYSTEROSCOPY WITH RESECTOCOPE;  Surgeon: Leslie Andrea, MD;  Location: [facility];  Service: Gynecology;  Laterality: N/A;  no resection done  . DILATION AND CURETTAGE OF UTERUS     x 2 for polyp removal  . HAND SURGERY     as a child -tendons & ligaments repair  . polypectomy    . WISDOM TOOTH EXTRACTION     Family History  Problem Relation Age of Onset  . Diabetes Mother   . Hypertension Father   . Hyperlipidemia Father    Social History  Substance Use Topics  . Smoking status: Never Smoker  . Smokeless tobacco: Never Used  . Alcohol use No   OB History    Gravida Para Term Preterm AB Living   2 2   2  0 1   SAB TAB Ectopic Multiple Live Births   0     0 2      Obstetric Comments   Twin pregnancy, Twin A PPROM and vaginal delivery on 08-11-14(SAB) demise.  Twin B, C/S for breech, PTL, Plac abruption on 09/2014 at [redacted]w[redacted]d.  Baby to NICU.  Baby B also passed away in NICU. LTCS per op note     Review of Systems  Constitutional: Positive for fatigue. Negative for chills and fever.  HENT: Positive for sore throat. Negative for congestion, ear pain, mouth sores and sinus pressure.   Respiratory: Negative for cough and shortness of breath.   Cardiovascular: Negative for chest pain.  Gastrointestinal: Negative for abdominal pain.  Neurological:  Positive for headaches. Negative for dizziness and weakness.    Allergies  Review of patient's allergies indicates no known allergies.  Home Medications   Prior to Admission medications   Medication Sig Start Date End Date Taking? Authorizing Provider  cholecalciferol (VITAMIN D) 1000 units tablet Take 4,000 Units by mouth daily.   Yes Historical Provider, MD  docusate sodium (COLACE) 100 MG capsule Take 100 mg by mouth 2 (two) times daily as needed for mild constipation.    Yes Historical Provider, MD  Prenatal Vit-Fe Fumarate-FA  (PRENATAL MULTIVITAMIN) TABS tablet Take 1 tablet by mouth daily.    Yes Historical Provider, MD  acetaminophen (TYLENOL) 325 MG tablet Take 325 mg by mouth every 6 (six) hours as needed for mild pain, moderate pain or headache.     Historical Provider, MD  ibuprofen (ADVIL,MOTRIN) 600 MG tablet Take 1 tablet (600 mg total) by mouth every 6 (six) hours. 11/08/15   Julio Sicksarol Curtis, NP  penciclovir (DENAVIR) 1 % cream Apply 1 application topically every 2 (two) hours as needed (for cold sores).    Historical Provider, MD  penicillin v potassium (VEETID) 500 MG tablet Take 1 tablet (500 mg total) by mouth 2 (two) times daily. 11/16/15 11/26/15  Sudie GrumblingAnn Berry Amyot, NP  valACYclovir (VALTREX) 500 MG tablet Take 500 mg by mouth daily.    Historical Provider, MD   Meds Ordered and Administered this Visit  Medications - No data to display  BP 128/70 (BP Location: Left Arm)   Pulse 84   Temp 98.4 F (36.9 C) (Oral)   Resp 20   SpO2 100%   Breastfeeding? Yes  No data found.   Physical Exam  Constitutional: She is oriented to person, place, and time. She appears well-developed and well-nourished. No distress.  HENT:  Head: Normocephalic and atraumatic.  Right Ear: Hearing, tympanic membrane, external ear and ear canal normal.  Left Ear: Hearing, tympanic membrane, external ear and ear canal normal.  Nose: Nose normal.  Mouth/Throat: Uvula is midline and mucous membranes are normal. Posterior oropharyngeal erythema present.  Neck: Normal range of motion. Neck supple.  Cardiovascular: Normal rate, regular rhythm and normal heart sounds.   Pulmonary/Chest: Effort normal and breath sounds normal. She has no wheezes. She has no rales.  Lymphadenopathy:    She has cervical adenopathy.       Right cervical: Superficial cervical adenopathy present.       Left cervical: Superficial cervical adenopathy present.  Neurological: She is alert and oriented to person, place, and time.  Skin: Skin is warm and dry.   Psychiatric: She has a normal mood and affect. Her behavior is normal. Judgment and thought content normal.    Urgent Care Course   Clinical Course    Procedures (including critical care time)  Labs Review Labs Reviewed  CULTURE, GROUP A STREP [facility])  POCT RAPID STREP A    Imaging Review No results found.   Visual Acuity Review  Right Eye Distance:   Left Eye Distance:   Bilateral Distance:    Right Eye Near:   Left Eye Near:    Bilateral Near:         MDM   1. Pharyngitis    Discussed that rapid strep test was negative. Recommend continue Ibuprofen 600mg  every 8 hours as needed for pain. Since husband is positive for strep, Rx provided for Pen VK 500mg  twice a day for 10 days- pt will not fill Rx until strep culture results are available.  Recommend increase fluid intake and rest. Follow-up pending lab results.     Sudie Grumbling, NP 11/17/15 [phone]

## 2015-11-16 NOTE — Discharge Instructions (Signed)
Recommend hold Rx for PCN until strep culture results are available or if throat symptoms worsen, may start twice a day. Use Ibuprofen as needed for pain. Follow-up in 3 to 4 days if not improving.

## 2015-11-19 LAB — CULTURE, GROUP A STREP (THRC)

## 2015-11-22 ENCOUNTER — Ambulatory Visit: Payer: Self-pay

## 2015-11-22 NOTE — Lactation Note (Signed)
This note was copied from a baby's chart. Lactation Consultation Note  Patient Name: Andrea Julianne HandlerLachina Young ZOXWR'UToday's Date: 11/22/2015  Pecola LeisureBaby is 682 weeks old and mom states milk is slowly increasing in volume.  She states she tries to pump every 3 hours but sometimes is too tired.  She obtains 60 mls with most pumps.  Reviewed importance of pumping 8-12 times in 24 hours.  Instructed on power pumping and recommended she do this once in AM and once in PM.  Mom not sure if she wants to put baby to breast or pump and bottle feed.  Encouraged to ask for assist if she would like to put baby to breast.     Maternal Data    Feeding Feeding Type: Breast Milk Nipple Type: Slow - flow Length of feed: 35 min  LATCH Score/Interventions                      Lactation Tools Discussed/Used     Consult Status      Huston FoleyMOULDEN, Harshita Bernales S 11/22/2015, 3:51 PM

## 2015-11-23 MED FILL — VALACYCLOVIR HCL 500 MG TAB: 500 | 30 days supply | Qty: 30 | Fill #1

## 2015-11-29 ENCOUNTER — Ambulatory Visit: Payer: Self-pay

## 2015-11-29 NOTE — Lactation Note (Signed)
This note was copied from a baby's chart. Lactation Consultation Note  Patient Name: Andrea Julianne HandlerLachina Friend WUJWJ'XToday's Date: 11/29/2015 Reason for consult: Follow-up assessment;NICU baby;Infant < 6lbs   Follow up with mom of 423 week old infant in NICU. Mom reports she is planning to pump and bottle feed only. MOm declined assistance with latching infant. Mom reports she is able to pump just what infant needs with no extra to store. Infant is receiving supplementation with powdered formula for increased calories. Mom is drinking Mother's Milk Tea. She is pumping about 7 x a day. Enc mom to increase pumpings to 8 / day and enc her to add power pumping once a day. Gave her information in Mother Love More Milk Plus and advised her to talk with her OB prior to taking. 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 Enc [facility] to [facility] with any questions/concerns prn, mom reports she has the phone #.    Maternal Data Does the patient have breastfeeding experience prior to this delivery?: No  Feeding    LATCH Score/Interventions                      Lactation Tools Discussed/Used     Consult Status Consult Status: PRN Follow-up type: Call as needed    Ed BlalockSharon S Shaquelle Hernon 11/29/2015, 10:33 AM

## 2015-11-30 DIAGNOSIS — M79631 Pain in right forearm: Secondary | ICD-10-CM | POA: Diagnosis not present

## 2015-11-30 DIAGNOSIS — S300XXA Contusion of lower back and pelvis, initial encounter: Secondary | ICD-10-CM | POA: Diagnosis not present

## 2015-12-13 DIAGNOSIS — Z1389 Encounter for screening for other disorder: Secondary | ICD-10-CM | POA: Diagnosis not present

## 2016-01-04 MED FILL — VALACYCLOVIR HCL 500 MG TAB: 500 | 30 days supply | Qty: 30 | Fill #2

## 2016-01-15 DIAGNOSIS — N979 Female infertility, unspecified: Secondary | ICD-10-CM | POA: Diagnosis not present

## 2016-02-02 MED FILL — VALACYCLOVIR HCL 500 MG TAB: 500 | 30 days supply | Qty: 30 | Fill #3

## 2016-02-19 DIAGNOSIS — N979 Female infertility, unspecified: Secondary | ICD-10-CM | POA: Diagnosis not present

## 2016-03-01 MED FILL — VALACYCLOVIR HCL 500 MG TAB: 500 | 30 days supply | Qty: 30 | Fill #4

## 2016-04-08 MED FILL — VALACYCLOVIR HCL 500 MG TAB: 500 | 30 days supply | Qty: 30 | Fill #5

## 2016-04-17 DIAGNOSIS — L03113 Cellulitis of right upper limb: Secondary | ICD-10-CM | POA: Diagnosis not present

## 2016-04-17 DIAGNOSIS — R7303 Prediabetes: Secondary | ICD-10-CM | POA: Diagnosis not present

## 2016-04-17 MED FILL — predniSONE 10 MG (21) TBPK: 10 | 6 days supply | Qty: 21 | Fill #0

## 2016-04-17 MED FILL — DOXYCYCLINE HYCLATE 100 MG: 100 | 10 days supply | Qty: 20 | Fill #0

## 2016-05-06 MED FILL — VALACYCLOVIR HCL 500 MG TAB: 500 | 30 days supply | Qty: 30 | Fill #6

## 2016-05-07 ENCOUNTER — Other Ambulatory Visit: Payer: Self-pay | Admitting: Family

## 2016-05-07 DIAGNOSIS — Z1231 Encounter for screening mammogram for malignant neoplasm of breast: Secondary | ICD-10-CM

## 2016-05-24 MED FILL — AMOX TR-K CLV 875-125 MG TA: 875-125 | 10 days supply | Qty: 20 | Fill #0

## 2016-05-29 ENCOUNTER — Ambulatory Visit
Admission: RE | Admit: 2016-05-29 | Discharge: 2016-05-29 | Disposition: A | Discharge status: Home / Self Care | Payer: 59 | Source: Ambulatory Visit | Attending: Family | Admitting: Family

## 2016-05-29 ENCOUNTER — Ambulatory Visit: Payer: 59

## 2016-05-29 DIAGNOSIS — Z1231 Encounter for screening mammogram for malignant neoplasm of breast: Secondary | ICD-10-CM

## 2016-06-07 MED FILL — VALACYCLOVIR HCL 500 MG TAB: 500 | 30 days supply | Qty: 30 | Fill #7

## 2016-06-19 DIAGNOSIS — J0100 Acute maxillary sinusitis, unspecified: Secondary | ICD-10-CM | POA: Diagnosis not present

## 2016-06-19 DIAGNOSIS — H1033 Unspecified acute conjunctivitis, bilateral: Secondary | ICD-10-CM | POA: Diagnosis not present

## 2016-06-19 MED FILL — AZITHROMYCIN 250 MG TAB: 250 | 5 days supply | Qty: 6 | Fill #0

## 2016-06-25 ENCOUNTER — Other Ambulatory Visit: Payer: Self-pay | Admitting: Internal Medicine

## 2016-06-25 MED FILL — OLOPATADINE HCL 0.2 % SOLN: 0.2 | 25 days supply | Qty: 3 | Fill #0

## 2016-07-16 MED FILL — VALACYCLOVIR HCL 500 MG TAB: 500 | 30 days supply | Qty: 30 | Fill #8

## 2016-07-24 DIAGNOSIS — B009 Herpesviral infection, unspecified: Secondary | ICD-10-CM | POA: Diagnosis not present

## 2016-07-24 DIAGNOSIS — R7303 Prediabetes: Secondary | ICD-10-CM | POA: Diagnosis not present

## 2016-07-24 DIAGNOSIS — E282 Polycystic ovarian syndrome: Secondary | ICD-10-CM | POA: Diagnosis not present

## 2016-07-24 DIAGNOSIS — O24419 Gestational diabetes mellitus in pregnancy, unspecified control: Secondary | ICD-10-CM | POA: Diagnosis not present

## 2016-07-24 MED FILL — metFORMIN HCL 500 MG TABS: 500 | 90 days supply | Qty: 180 | Fill #0

## 2016-08-19 MED FILL — VALACYCLOVIR HCL 500 MG TAB: 500 | 30 days supply | Qty: 30 | Fill #9

## 2016-09-18 MED FILL — VALACYCLOVIR HCL 500 MG TAB: 500 | 30 days supply | Qty: 30 | Fill #10

## 2016-10-22 MED FILL — VALACYCLOVIR HCL 500 MG TAB: 500 | 30 days supply | Qty: 30 | Fill #11

## 2016-11-05 DIAGNOSIS — L680 Hirsutism: Secondary | ICD-10-CM | POA: Diagnosis not present

## 2016-11-05 DIAGNOSIS — N979 Female infertility, unspecified: Secondary | ICD-10-CM | POA: Diagnosis not present

## 2016-11-05 DIAGNOSIS — R7303 Prediabetes: Secondary | ICD-10-CM | POA: Diagnosis not present

## 2016-11-05 DIAGNOSIS — Z833 Family history of diabetes mellitus: Secondary | ICD-10-CM | POA: Diagnosis not present

## 2016-11-05 DIAGNOSIS — Z8632 Personal history of gestational diabetes: Secondary | ICD-10-CM | POA: Diagnosis not present

## 2016-11-05 DIAGNOSIS — E282 Polycystic ovarian syndrome: Secondary | ICD-10-CM | POA: Diagnosis not present

## 2016-11-05 DIAGNOSIS — N926 Irregular menstruation, unspecified: Secondary | ICD-10-CM | POA: Diagnosis not present

## 2016-11-05 DIAGNOSIS — E559 Vitamin D deficiency, unspecified: Secondary | ICD-10-CM | POA: Diagnosis not present

## 2016-11-15 MED FILL — VALACYCLOVIR HCL 500 MG TAB: 500 | 90 days supply | Qty: 90 | Fill #0

## 2016-12-03 DIAGNOSIS — E282 Polycystic ovarian syndrome: Secondary | ICD-10-CM | POA: Diagnosis not present

## 2016-12-03 DIAGNOSIS — O24419 Gestational diabetes mellitus in pregnancy, unspecified control: Secondary | ICD-10-CM | POA: Diagnosis not present

## 2016-12-03 DIAGNOSIS — Z136 Encounter for screening for cardiovascular disorders: Secondary | ICD-10-CM | POA: Diagnosis not present

## 2016-12-03 DIAGNOSIS — Z0000 Encounter for general adult medical examination without abnormal findings: Secondary | ICD-10-CM | POA: Diagnosis not present

## 2016-12-03 DIAGNOSIS — K5900 Constipation, unspecified: Secondary | ICD-10-CM | POA: Diagnosis not present

## 2016-12-03 DIAGNOSIS — Z1389 Encounter for screening for other disorder: Secondary | ICD-10-CM | POA: Diagnosis not present

## 2016-12-03 DIAGNOSIS — R7303 Prediabetes: Secondary | ICD-10-CM | POA: Diagnosis not present

## 2016-12-24 MED FILL — AMOXICILLIN 500 MG CAPSULE: 500 | 7 days supply | Qty: 21 | Fill #0

## 2017-01-01 ENCOUNTER — Ambulatory Visit: Payer: Self-pay | Admitting: Nurse Practitioner

## 2017-01-01 VITALS — BP 120/75 | HR 70 | Temp 98.7°F | Resp 16 | Wt 202.2 lb

## 2017-01-01 DIAGNOSIS — T7840XD Allergy, unspecified, subsequent encounter: Secondary | ICD-10-CM

## 2017-01-01 DIAGNOSIS — W57XXXD Bitten or stung by nonvenomous insect and other nonvenomous arthropods, subsequent encounter: Secondary | ICD-10-CM

## 2017-01-01 MED ORDER — PREDNISONE 10 MG (21) PO TBPK: ORAL_TABLET | ORAL | 0 refills | Status: AC

## 2017-01-01 MED FILL — predniSONE 10 MG (21) TBPK: 10 | 6 days supply | Qty: 21 | Fill #0

## 2017-01-01 NOTE — Patient Instructions (Addendum)
Insect Bite, Adult An insect bite can make your skin red, itchy, and swollen. An insect bite is different from an insect sting, which happens when an insect injects poison (venom) into the skin. Some insects can spread disease to people through a bite. However, most insect bites do not lead to disease and are not serious. What are the causes? Insects may bite for a variety of reasons, including:  Hunger.  To defend themselves.  Insects that bite include:  Spiders.  Mosquitoes.  Ticks.  Fleas.  Ants.  Flies.  Bedbugs.  What are the signs or symptoms? Symptoms of this condition include:  Itching or pain in the bite area.  Redness and swelling in the bite area.  An open wound (skin ulcer).  In many cases, symptoms last for 2-4 days. How is this diagnosed? This condition is usually diagnosed based on symptoms and a physical exam. How is this treated? Treatment is usually not needed. Symptoms often go away on their own. When treatment is recommended, it may involve:  Applying a cream or lotion to the bitten area. This treatment helps with itching.  Taking an antibiotic medicine. This treatment is needed if the bite area gets infected.  Getting a tetanus shot.  Applying ice to the affected area.  Medicines called antihistamines. This treatment is needed if you develop an allergic reaction to the insect bite.  Follow these instructions at home: Bite area care  Do not scratch the bite area.  Keep the bite area clean and dry. Wash it every day with soap and water as told by your health care provider.  Check the bite area every day for signs of infection. Check for: ? More redness, swelling, or pain. ? Fluid or blood. ? Warmth. ? Pus. Managing pain, itching, and swelling   You may apply a baking soda paste, cortisone cream, or calamine lotion to the bite area as told by your health care provider.  If directed, applyice to the bite area. ? Put ice in a  plastic bag. ? Place a towel between your skin and the bag. ? Leave the ice on for 20 minutes, 2-3 times per day. Medicines  Apply or take over-the-counter and prescription medicines only as told by your health care provider.  If you were prescribed an antibiotic medicine, use it as told by your health care provider. Do not stop using the antibiotic even if your condition improves.  Use Benadryl at night for itching, Zyrtec during the day.  Complete medications as directed.  General instructions  Keep all follow-up visits as told by your health care provider. This is important. How is this prevented? To help reduce your risk of insect bites:  When you are outdoors, wear clothing that covers your arms and legs.  Use insect repellent. The best insect repellents contain: ? DEET, picaridin, oil of lemon eucalyptus (OLE), or IR3535. ? Higher amounts of an active ingredient.  If your home windows do not have screens, consider installing them.  Contact a health care provider if:  You have more redness, swelling, or pain in the bite area.  You have fluid, blood, or pus coming from the bite area.  The bite area feels warm to the touch.  You have a fever. Get help right away if:  You have joint pain.  You have a rash.  You have shortness of breath.  You feel unusually tired or sleepy.  You have neck pain.  You have a headache.  You have unusual  weakness.  You have chest pain.  You have nausea, vomiting, or pain in the abdomen. This information is not intended to replace advice given to you by your health care provider. Make sure you discuss any questions you have with your health care provider. Document Released: 03/14/2004 Document Revised: 10/04/2015 Document Reviewed: 08/14/2015 Elsevier Interactive Patient Education  [employer]2018 [employer]

## 2017-01-01 NOTE — Progress Notes (Signed)
Subjective:  Andrea Young is a 41 y.o. female who presents for evaluation of an insect bite.  Patient states she was working out in a room in her home this am and noticed the bite afterwards.  The patient denies fever, chills and drainage, SOB or difficulty breathing.  Patient does admit to rash on her right abdomen with burning and itching pain.  Patient denies history of diabetes.     Review of Systems  Constitutional: Negative.   HENT: Negative.   Eyes: Negative.   Respiratory: Negative.   Cardiovascular: Negative.   Skin: Positive for rash.       Right abdomen  Psychiatric/Behavioral: Negative.       Objective:  Physical Exam  Constitutional: She appears well-developed and well-nourished. No distress.  HENT:  Head: Normocephalic and atraumatic.  Neck: Normal range of motion. Neck supple.  Cardiovascular: Normal rate, regular rhythm and normal heart sounds.  Pulmonary/Chest: Effort normal and breath sounds normal.  Abdominal: Soft.  Rash to right quadrant of abdomen with redness and moderate erythema, area warm to touch. No drainage  Skin: Rash noted. Erythema: right abdomen, area erythematous, warm to palpation, with moderate swelling spanning from umbilicus to right flank,  Psychiatric: She has a normal mood and affect. Her behavior is normal. Judgment and thought content normal.      Assessment:  an unknown insect, Bug Bite       Plan:  Sterapred Dose Pak.  Use Bendadryl at night and use Zyrtec during the day until symptoms resolve.

## 2017-01-02 DIAGNOSIS — R590 Localized enlarged lymph nodes: Secondary | ICD-10-CM | POA: Diagnosis not present

## 2017-01-02 DIAGNOSIS — L538 Other specified erythematous conditions: Secondary | ICD-10-CM | POA: Diagnosis not present

## 2017-01-30 MED FILL — metFORMIN HCL 500 MG TABS: 500 | 90 days supply | Qty: 180 | Fill #1

## 2017-02-04 DIAGNOSIS — Z01419 Encounter for gynecological examination (general) (routine) without abnormal findings: Secondary | ICD-10-CM | POA: Diagnosis not present

## 2017-02-04 DIAGNOSIS — R6882 Decreased libido: Secondary | ICD-10-CM | POA: Diagnosis not present

## 2017-02-04 DIAGNOSIS — Z6830 Body mass index (BMI) 30.0-30.9, adult: Secondary | ICD-10-CM | POA: Diagnosis not present

## 2017-02-08 IMAGING — US US MFM OB LIMITED
1 series · 15 of 28 positions shown · non-contrast
Comparison: none

[Series 1: us mfm ob limited · 42 acquisitions, 15 frames shown]
[im 1/42]
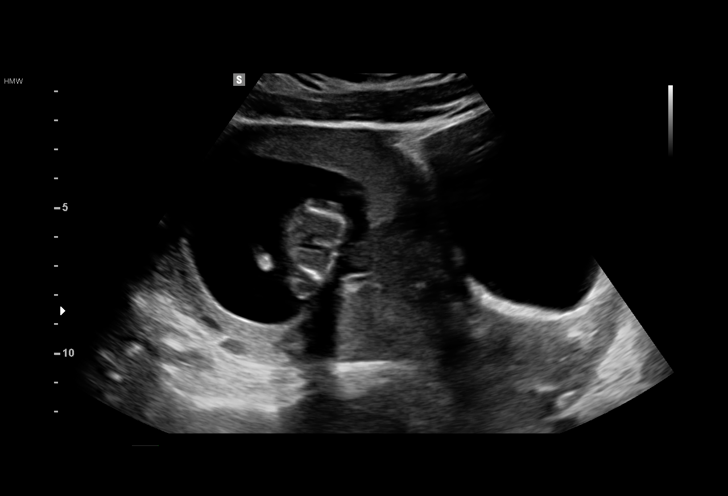
[im 4/42]
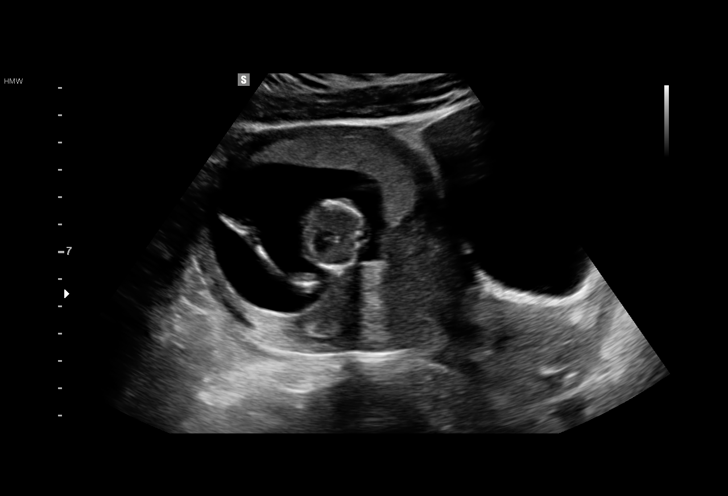
[im 7/42]
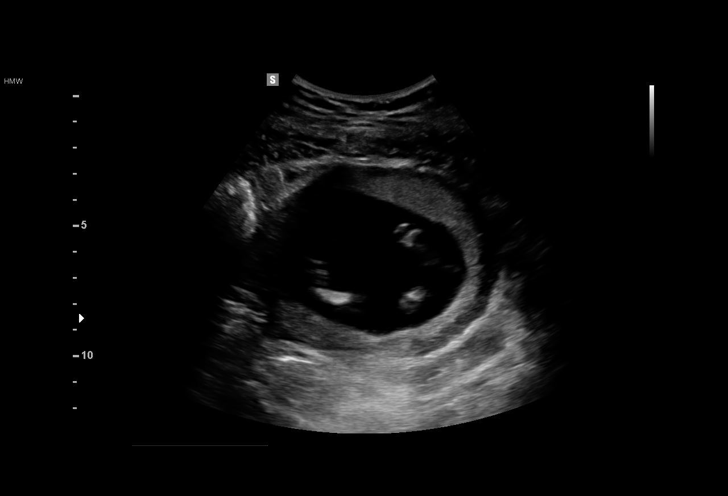
[im 10/42]
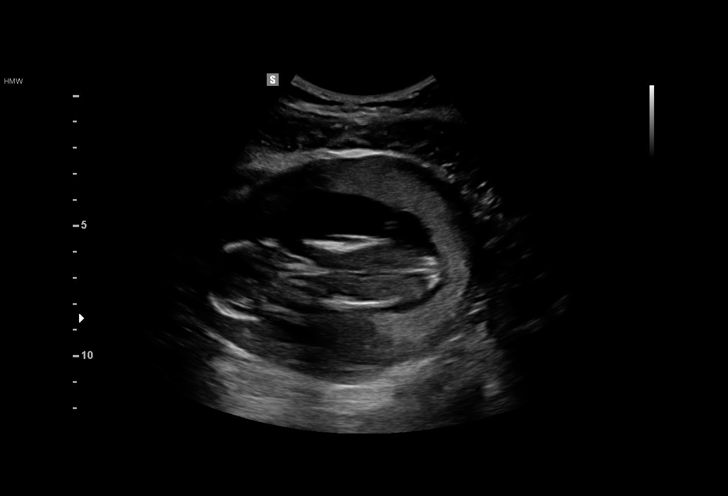
[im 13/42]
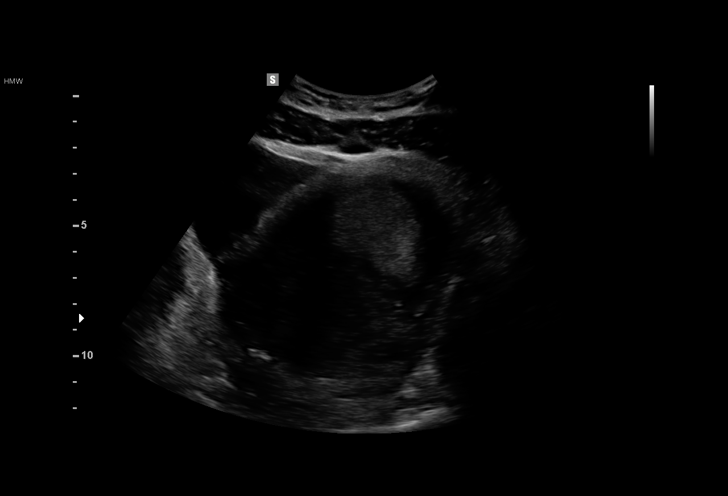
[im 16/42]
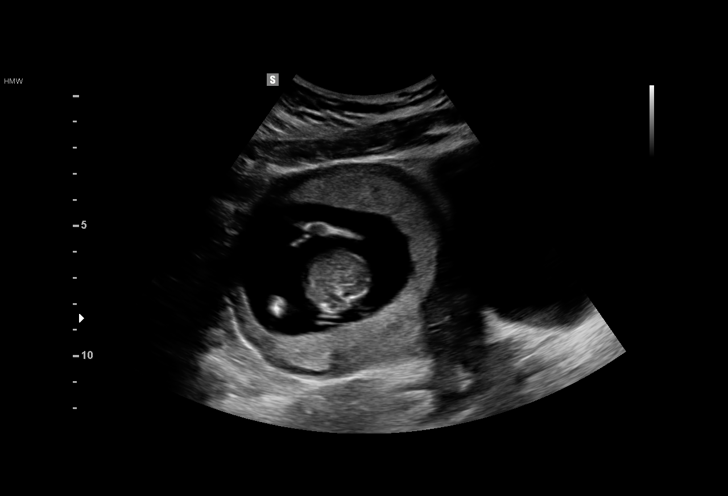
[im 19/42]
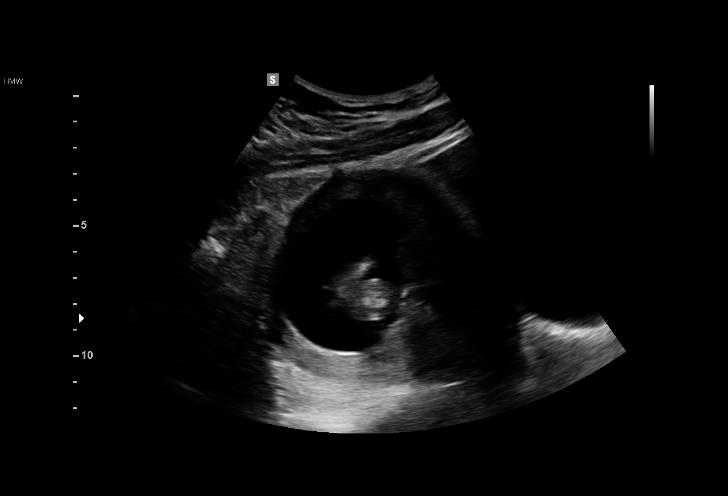
[im 22/42]
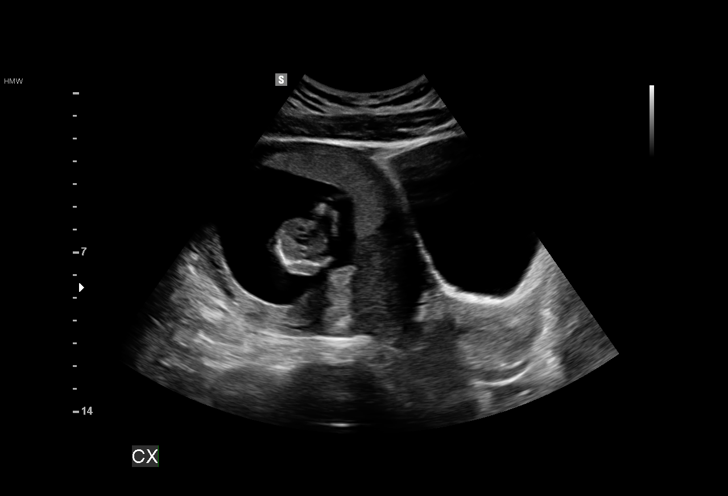
[im 23/42]
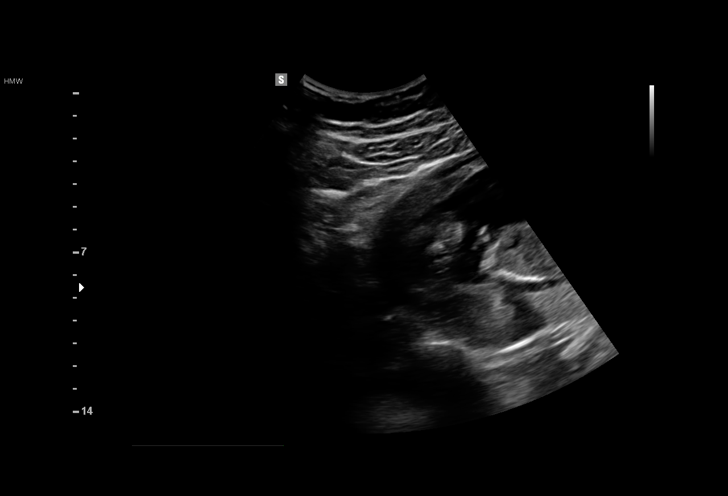
[im 26/42]
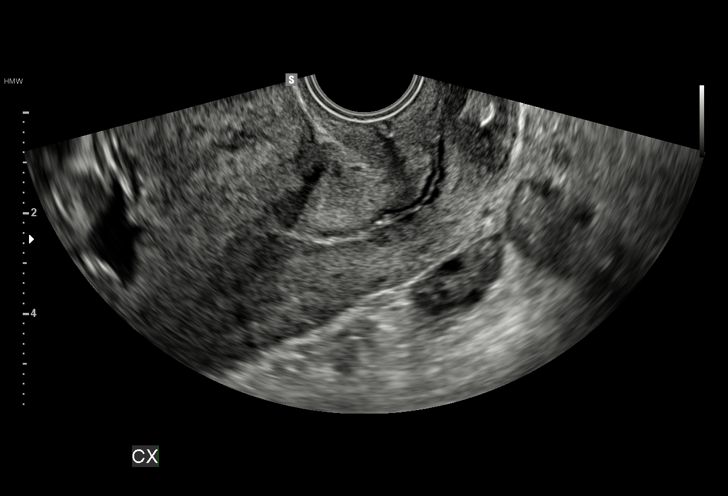
[im 29/42]
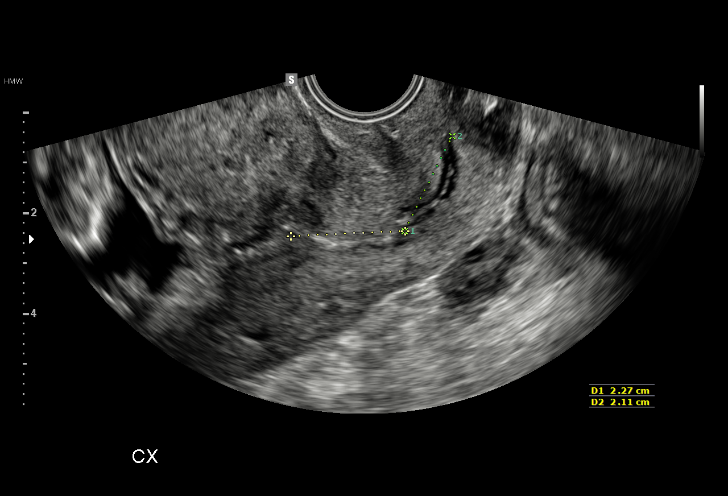
[im 32/42]
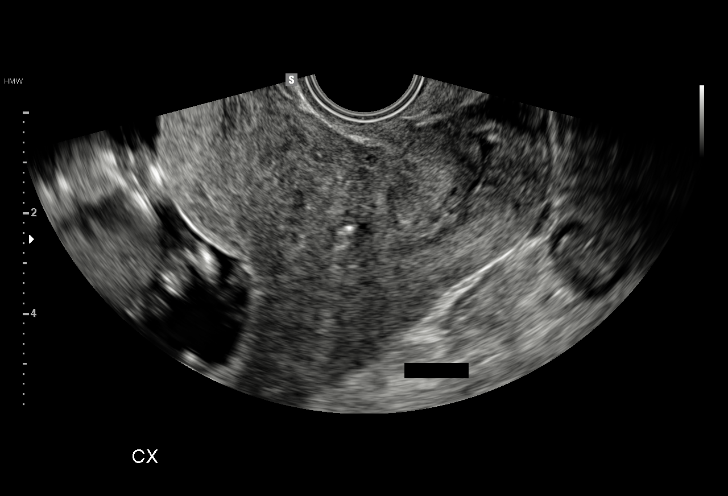
[im 35/42]
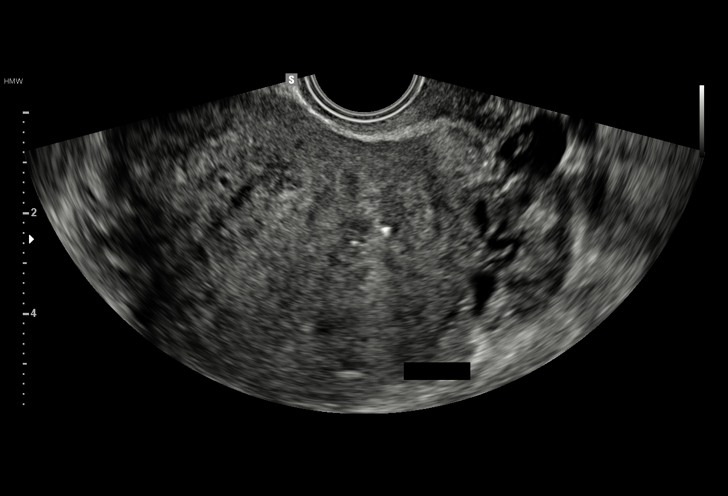
[im 38/42]
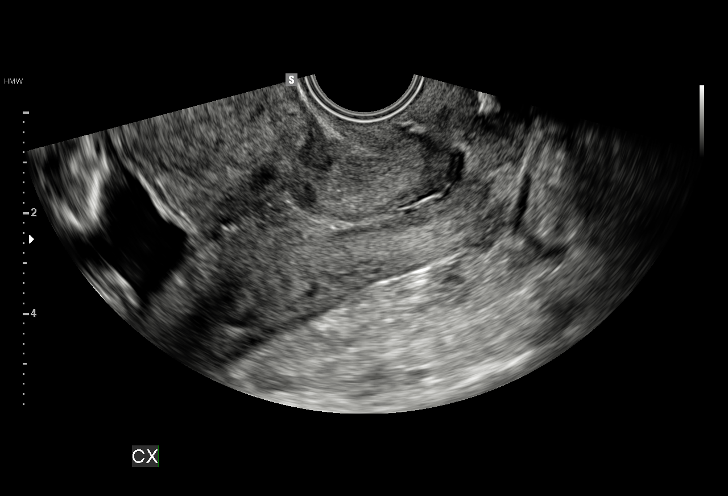
[im 42/42]
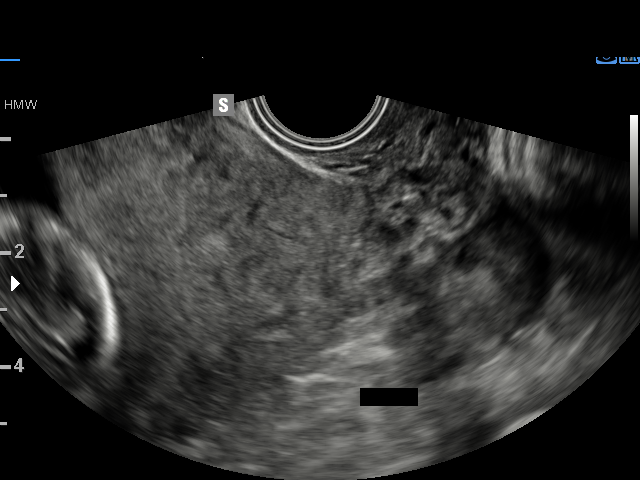

[15 of 28 positions shown; findings below may reference images not displayed]

[address]
[facility] [HOSPITAL]
MAU/Triage

1  TAFARI GARGIULO            [id number]      [id number]     [id number]
2  TAFARI GARGIULO            [id number]      [id number]     [id number]
Indications

15 weeks gestation of pregnancy
Cervical incompetence, second trimester
Cervical cerclage suture present, second
trimester - placed 06/19/15
Vaginal bleeding in pregnancy, second
trimester
OB History

Gravidity:    2         Term:   1
Living:       1
Fetal Evaluation

Num Of Fetuses:     1
Fetal Heart         143
Rate(bpm):
Cardiac Activity:   Observed
Presentation:       Transverse, head to maternal right
Placenta:           Left lateral
P. Cord Insertion:  Visualized, central

Amniotic Fluid
AFI FV:      Subjectively within normal limits

Largest Pocket(cm)
4.8
Comment:    No subchorionic hemorrhage seen.
Gestational Age

LMP:           17w 5d       Date:   03/03/15                 EDD:   12/08/15
Clinical EDD:  15w 4d                                        EDD:   12/23/15
Best:          15w 4d    Det. By:   Clinical EDD             EDD:   12/23/15
Cervix Uterus Adnexa

Cervix
Length:            3.7  cm.
Measured transvaginally. Cerclage visualized.

Uterus
No abnormality visualized.

Left Ovary
No adnexal mass visualized.

Right Ovary
No adnexal mass visualized.

Cul De Sac:   No free fluid seen.

Adnexa:       No abnormality visualized.
Impression

Singleton intrauterine pregnancy at 15 weeks 4 days
gestation with fetal cardiac activity
Transverse presentation
Cervical length of 
3.7cm
No sonographic evidence of intrauterine bleed
Recommendations

Follow-up ultrasounds as clinically indicated.

## 2017-02-19 MED FILL — VALACYCLOVIR HCL 500 MG TAB: 500 | 90 days supply | Qty: 90 | Fill #1

## 2017-02-25 DIAGNOSIS — J029 Acute pharyngitis, unspecified: Secondary | ICD-10-CM | POA: Diagnosis not present

## 2017-02-25 DIAGNOSIS — J069 Acute upper respiratory infection, unspecified: Secondary | ICD-10-CM | POA: Diagnosis not present

## 2017-02-25 DIAGNOSIS — B9789 Other viral agents as the cause of diseases classified elsewhere: Secondary | ICD-10-CM | POA: Diagnosis not present

## 2017-02-25 DIAGNOSIS — R05 Cough: Secondary | ICD-10-CM | POA: Diagnosis not present

## 2017-02-25 MED FILL — GUAIATUSSIN AC LIQUID: 100-10 | 5 days supply | Qty: 150 | Fill #0

## 2017-02-25 MED FILL — predniSONE 20 MG TABS: 20 | 7 days supply | Qty: 7 | Fill #0

## 2017-03-05 MED FILL — DOXYCYCLINE HYCLATE 100 MG: 100 | 10 days supply | Qty: 20 | Fill #0

## 2017-03-10 DIAGNOSIS — J209 Acute bronchitis, unspecified: Secondary | ICD-10-CM | POA: Diagnosis not present

## 2017-03-10 MED FILL — levoFLOXacin 500 MG TABS: 500 | 7 days supply | Qty: 7 | Fill #0

## 2017-03-28 DIAGNOSIS — F4323 Adjustment disorder with mixed anxiety and depressed mood: Secondary | ICD-10-CM | POA: Diagnosis not present

## 2017-04-11 DIAGNOSIS — F4323 Adjustment disorder with mixed anxiety and depressed mood: Secondary | ICD-10-CM | POA: Diagnosis not present

## 2017-04-29 DIAGNOSIS — F419 Anxiety disorder, unspecified: Secondary | ICD-10-CM | POA: Diagnosis not present

## 2017-04-29 DIAGNOSIS — E282 Polycystic ovarian syndrome: Secondary | ICD-10-CM | POA: Diagnosis not present

## 2017-04-29 DIAGNOSIS — M25532 Pain in left wrist: Secondary | ICD-10-CM | POA: Diagnosis not present

## 2017-04-29 DIAGNOSIS — Z8632 Personal history of gestational diabetes: Secondary | ICD-10-CM | POA: Diagnosis not present

## 2017-04-29 MED FILL — buPROPion HCL ER (XL) 150 M: 150 | 30 days supply | Qty: 30 | Fill #0

## 2017-05-03 DIAGNOSIS — J029 Acute pharyngitis, unspecified: Secondary | ICD-10-CM | POA: Diagnosis not present

## 2017-05-06 DIAGNOSIS — N979 Female infertility, unspecified: Secondary | ICD-10-CM | POA: Diagnosis not present

## 2017-05-06 DIAGNOSIS — E669 Obesity, unspecified: Secondary | ICD-10-CM | POA: Diagnosis not present

## 2017-05-06 DIAGNOSIS — N926 Irregular menstruation, unspecified: Secondary | ICD-10-CM | POA: Diagnosis not present

## 2017-05-06 DIAGNOSIS — Z8632 Personal history of gestational diabetes: Secondary | ICD-10-CM | POA: Diagnosis not present

## 2017-05-06 DIAGNOSIS — L680 Hirsutism: Secondary | ICD-10-CM | POA: Diagnosis not present

## 2017-05-06 DIAGNOSIS — E559 Vitamin D deficiency, unspecified: Secondary | ICD-10-CM | POA: Diagnosis not present

## 2017-05-06 DIAGNOSIS — R7303 Prediabetes: Secondary | ICD-10-CM | POA: Diagnosis not present

## 2017-05-06 DIAGNOSIS — E282 Polycystic ovarian syndrome: Secondary | ICD-10-CM | POA: Diagnosis not present

## 2017-05-06 DIAGNOSIS — Z6830 Body mass index (BMI) 30.0-30.9, adult: Secondary | ICD-10-CM | POA: Diagnosis not present

## 2017-05-10 DIAGNOSIS — F4323 Adjustment disorder with mixed anxiety and depressed mood: Secondary | ICD-10-CM | POA: Diagnosis not present

## 2017-05-21 MED FILL — VALACYCLOVIR HCL 500 MG TAB: 500 | 90 days supply | Qty: 90 | Fill #2

## 2017-05-21 MED FILL — metFORMIN HCL 500 MG TABS: 500 | 90 days supply | Qty: 180 | Fill #2

## 2017-05-30 MED FILL — buPROPion HCL ER (XL) 150 M: 150 | 30 days supply | Qty: 30 | Fill #1

## 2017-05-31 DIAGNOSIS — F4323 Adjustment disorder with mixed anxiety and depressed mood: Secondary | ICD-10-CM | POA: Diagnosis not present

## 2017-06-11 DIAGNOSIS — L218 Other seborrheic dermatitis: Secondary | ICD-10-CM | POA: Diagnosis not present

## 2017-06-11 IMAGING — US US MFM FETAL BPP W/O NON-STRESS
1 series · 14 of 14 positions shown · non-contrast
Comparison: none

[Series 1: us mfm fetal bpp w/o non-stress · 14 acquisitions, 14 frames shown]
[im 1/14]
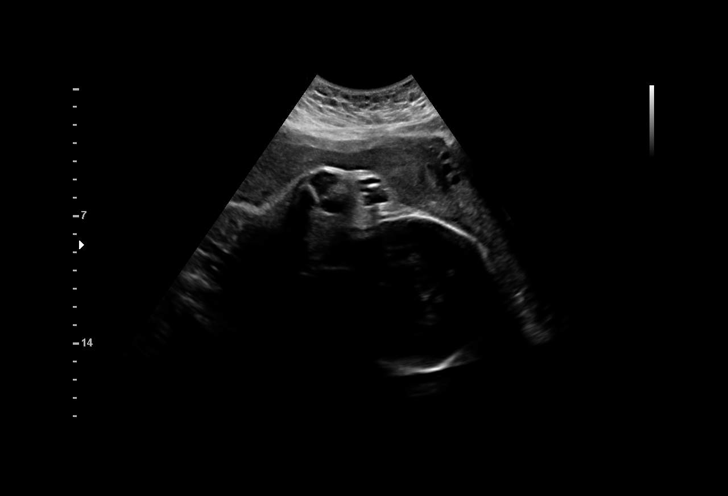
[im 2/14]
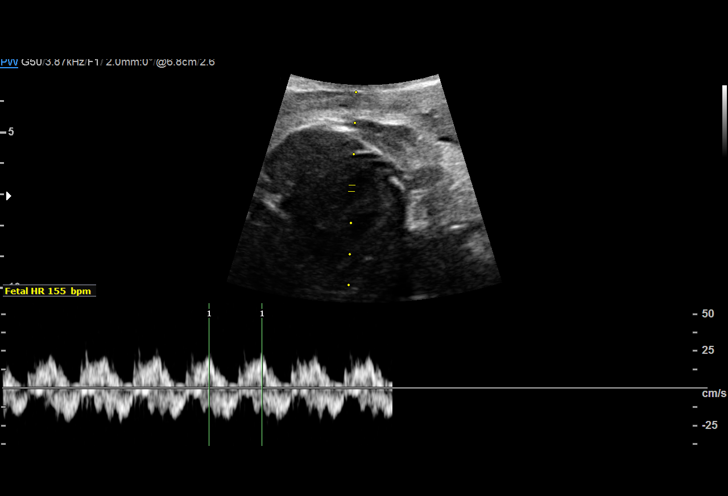
[im 3/14]
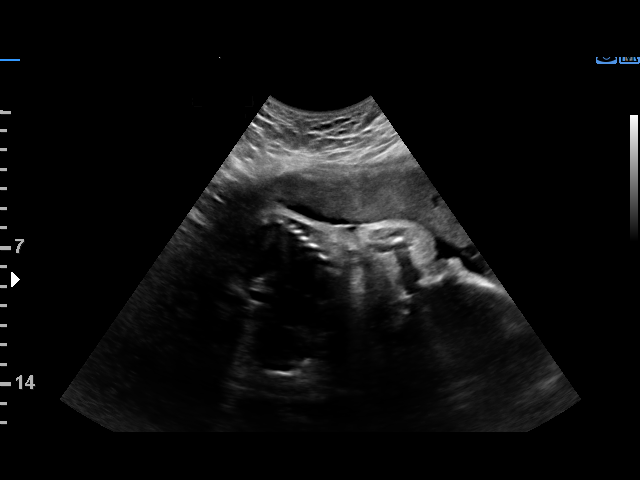
[im 4/14]
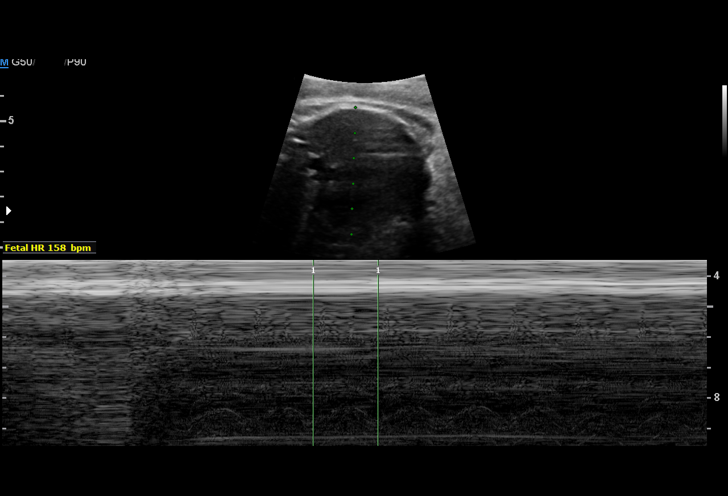
[im 5/14]
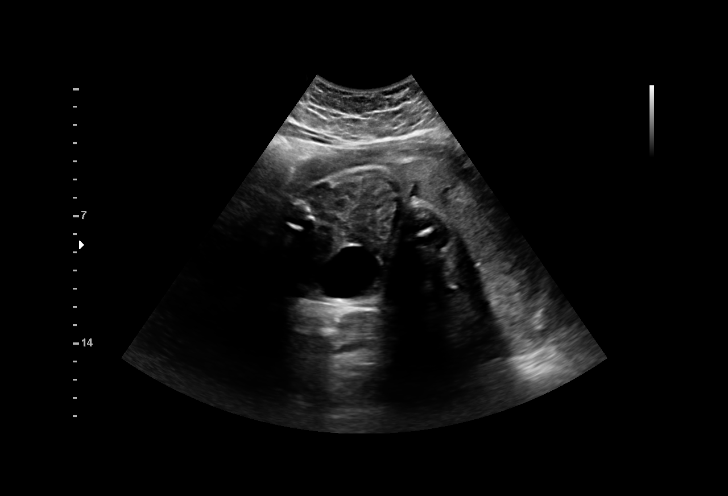
[im 6/14]
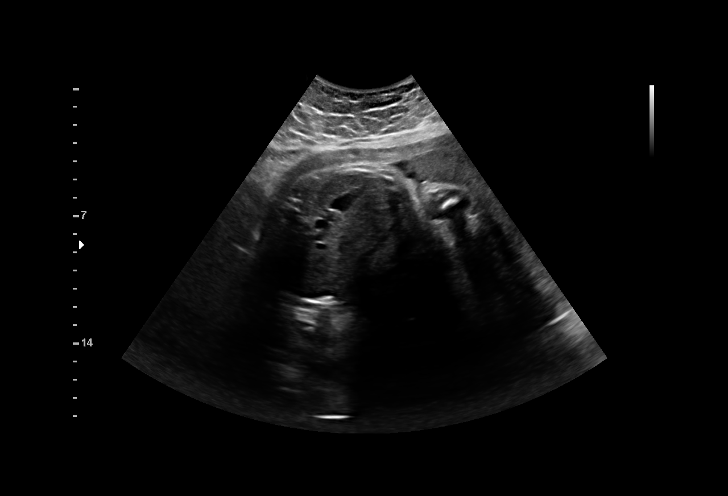
[im 7/14]
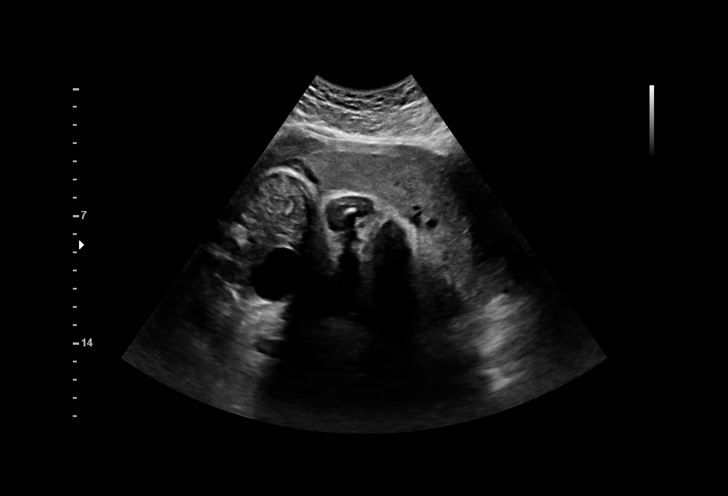
[im 8/14]
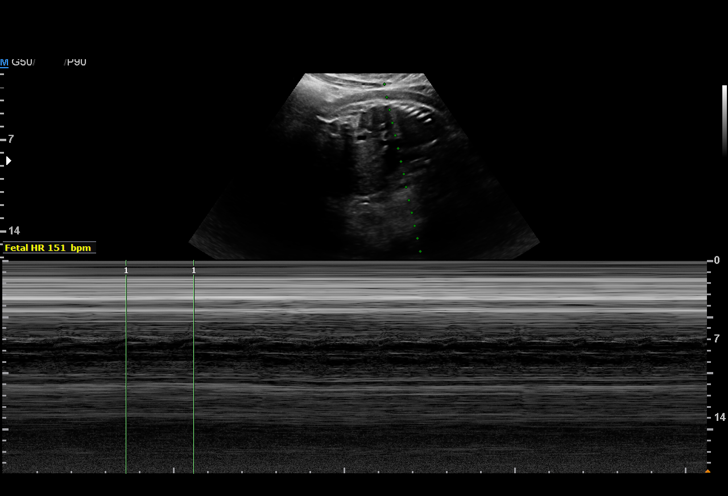
[im 9/14]
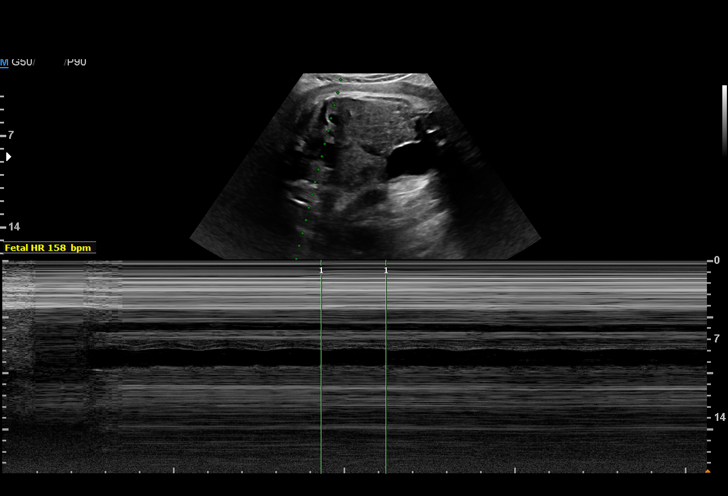
[im 10/14]
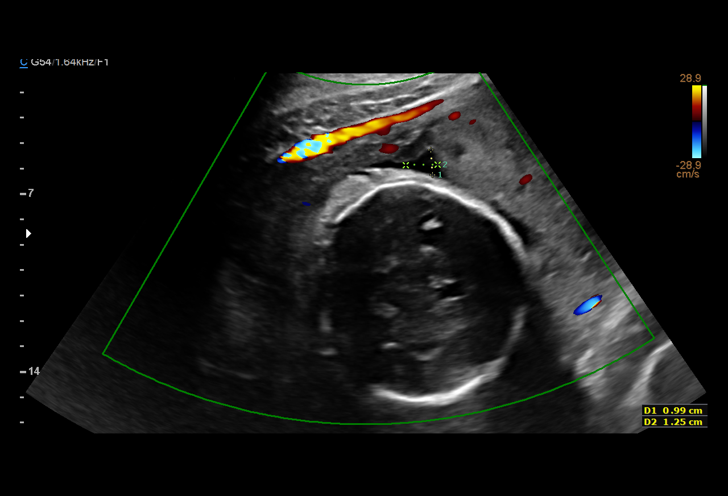
[im 11/14]
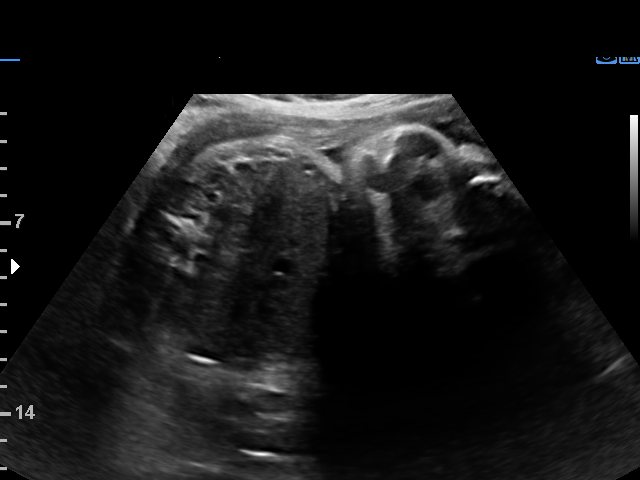
[im 12/14]
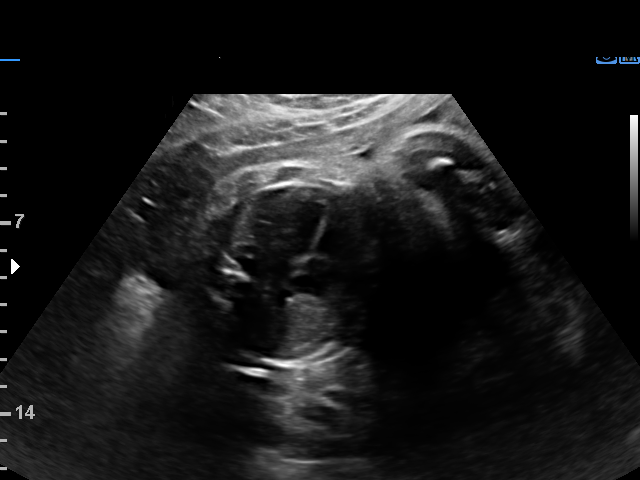
[im 13/14]
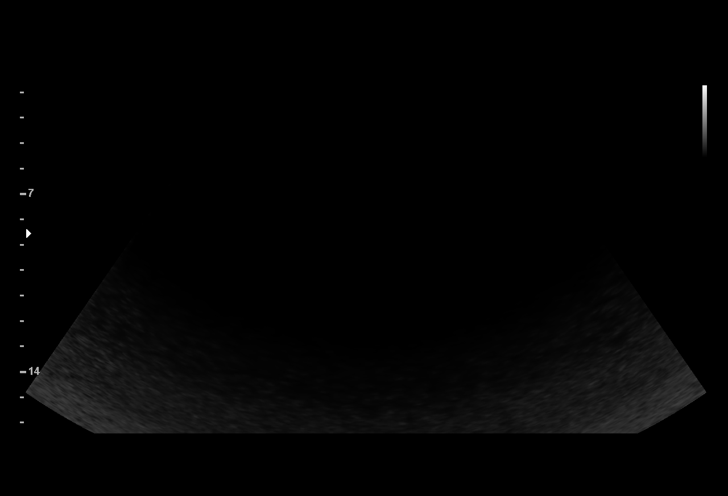
[im 14/14]
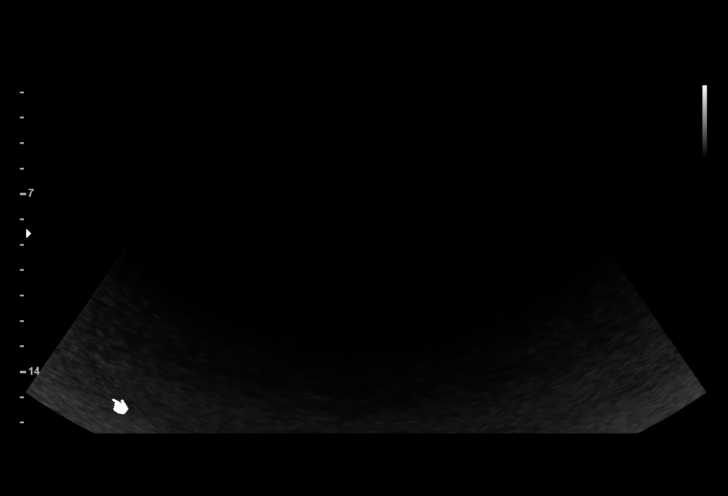

[14 of 14 positions shown; findings below may reference images not displayed]

[id number]
[facility] [HOSPITAL]
[REDACTED]7

1  DJATMIKO VANDER-KEIG             [id number]      [id number]     [id number]
Indications

33 weeks gestation of pregnancy
Premature rupture of membranes - leaking
fluid
Advanced maternal age multigravida 35+,
second trimester; low risk NIPS
Poor obstetric history: Previous neonatal
death  PTD 23 wks
Cervical cerclage suture present, third
trimester
Cervical insufficiency,3rd
Pregnancy resulting from assisted
reproductive technology: IVF
2 vessel umbilical cord
Gestational diabetes in pregnancy,
unspecified control
Non-reactive NST, FHR decelerations (pt on
monitor in Ante-minimal variability and
spontaneous decelerations x2)
OB History

Gravidity:    2         Term:   1         SAB:   1
Living:       0
Fetal Evaluation

Num Of Fetuses:     1
Fetal Heart         158
Rate(bpm):
Cardiac Activity:   Observed
Presentation:       Cephalic
Placenta:           Anterior, above cervical os

Amniotic Fluid
AFI FV:      Oligohydramnios

AFI Sum(cm)     %Tile       Largest Pocket(cm)
1.1             < 3

RUQ(cm)       RLQ(cm)       LUQ(cm)        LLQ(cm)
0             0             1.1            0
Biophysical Evaluation

Amniotic F.V:   Pocket < 2 cm two          F. Tone:         Not Observed
planes
F. Movement:    Not Observed               Score:           0/8
F. Breathing:   Not Observed
Gestational Age

LMP:           35w 2d        Date:  03/03/15                 EDD:    12/08/15
Best:          33w 1d     Det. By:  D.O. Conception          EDD:    12/23/15
(04/01/15)
Anatomy

Stomach:               Visualized             Bladder:                Visualized
Cervix Uterus Adnexa

Cervix
Not visualized (advanced GA >17wks)
Comments

BPP was stopped at 27 minutes by Dr. La Joda Arte due to
taking patient for emergency c-section. At that time, the BPP
was 0/8. Exam was done portably in the Antenatal unit.
Impression

SIUP at 33+1 weeks
Cephalic presentation
Oligohydramnios
BPP 0/8
Recommendations

Pt to OR for urgent C/S

## 2017-06-21 DIAGNOSIS — F4323 Adjustment disorder with mixed anxiety and depressed mood: Secondary | ICD-10-CM | POA: Diagnosis not present

## 2017-06-24 MED FILL — FLUOCINOLONE ACETONIDE SCAL: 0.01 | 30 days supply | Qty: 118 | Fill #0

## 2017-07-03 MED FILL — buPROPion HCL ER (XL) 150 M: 150 | 30 days supply | Qty: 30 | Fill #2

## 2017-08-05 DIAGNOSIS — G5602 Carpal tunnel syndrome, left upper limb: Secondary | ICD-10-CM | POA: Diagnosis not present

## 2017-08-06 MED FILL — buPROPion HCL ER (XL) 150 M: 150 | 30 days supply | Qty: 30 | Fill #3

## 2017-08-14 DIAGNOSIS — N979 Female infertility, unspecified: Secondary | ICD-10-CM | POA: Diagnosis not present

## 2017-08-16 DIAGNOSIS — F4323 Adjustment disorder with mixed anxiety and depressed mood: Secondary | ICD-10-CM | POA: Diagnosis not present

## 2017-08-19 MED FILL — VALACYCLOVIR HCL 500 MG TAB: 500 | 90 days supply | Qty: 90 | Fill #0

## 2017-08-23 DIAGNOSIS — G5602 Carpal tunnel syndrome, left upper limb: Secondary | ICD-10-CM | POA: Diagnosis not present

## 2017-08-28 ENCOUNTER — Other Ambulatory Visit: Payer: Self-pay | Admitting: Internal Medicine

## 2017-08-28 DIAGNOSIS — Z1231 Encounter for screening mammogram for malignant neoplasm of breast: Secondary | ICD-10-CM

## 2017-09-02 DIAGNOSIS — G5602 Carpal tunnel syndrome, left upper limb: Secondary | ICD-10-CM | POA: Diagnosis not present

## 2017-09-08 MED FILL — buPROPion HCL ER (XL) 150 M: 150 | 30 days supply | Qty: 30 | Fill #4

## 2017-09-18 ENCOUNTER — Ambulatory Visit: Payer: 59

## 2017-10-14 MED FILL — buPROPion HCL ER (XL) 150 M: 150 | 30 days supply | Qty: 30 | Fill #5

## 2017-11-18 MED FILL — VALACYCLOVIR HCL 500 MG TAB: 500 | 90 days supply | Qty: 90 | Fill #0

## 2017-11-18 MED FILL — buPROPion HCL ER (XL) 150 M: 150 | 30 days supply | Qty: 30 | Fill #6

## 2017-12-04 DIAGNOSIS — G5602 Carpal tunnel syndrome, left upper limb: Secondary | ICD-10-CM | POA: Diagnosis not present

## 2017-12-08 DIAGNOSIS — H52223 Regular astigmatism, bilateral: Secondary | ICD-10-CM | POA: Diagnosis not present

## 2017-12-12 DIAGNOSIS — R7303 Prediabetes: Secondary | ICD-10-CM | POA: Diagnosis not present

## 2017-12-16 DIAGNOSIS — E559 Vitamin D deficiency, unspecified: Secondary | ICD-10-CM | POA: Diagnosis not present

## 2017-12-16 DIAGNOSIS — Z8632 Personal history of gestational diabetes: Secondary | ICD-10-CM | POA: Diagnosis not present

## 2017-12-16 DIAGNOSIS — N926 Irregular menstruation, unspecified: Secondary | ICD-10-CM | POA: Diagnosis not present

## 2017-12-16 DIAGNOSIS — R7303 Prediabetes: Secondary | ICD-10-CM | POA: Diagnosis not present

## 2017-12-16 DIAGNOSIS — N979 Female infertility, unspecified: Secondary | ICD-10-CM | POA: Diagnosis not present

## 2017-12-16 DIAGNOSIS — E282 Polycystic ovarian syndrome: Secondary | ICD-10-CM | POA: Diagnosis not present

## 2017-12-16 DIAGNOSIS — L680 Hirsutism: Secondary | ICD-10-CM | POA: Diagnosis not present

## 2017-12-26 MED FILL — buPROPion HCL ER (XL) 150 M: 150 | 30 days supply | Qty: 30 | Fill #0

## 2018-01-19 MED FILL — AMOXICILLIN 500 MG CAPSULE: 500 | 7 days supply | Qty: 21 | Fill #0

## 2018-01-30 MED FILL — buPROPion HCL ER (XL) 150 M: 150 | 30 days supply | Qty: 30 | Fill #1

## 2018-02-09 DIAGNOSIS — F419 Anxiety disorder, unspecified: Secondary | ICD-10-CM | POA: Diagnosis not present

## 2018-02-09 DIAGNOSIS — R7303 Prediabetes: Secondary | ICD-10-CM | POA: Diagnosis not present

## 2018-02-09 DIAGNOSIS — E282 Polycystic ovarian syndrome: Secondary | ICD-10-CM | POA: Diagnosis not present

## 2018-02-09 DIAGNOSIS — J019 Acute sinusitis, unspecified: Secondary | ICD-10-CM | POA: Diagnosis not present

## 2018-02-09 DIAGNOSIS — Z1389 Encounter for screening for other disorder: Secondary | ICD-10-CM | POA: Diagnosis not present

## 2018-02-09 DIAGNOSIS — Z0000 Encounter for general adult medical examination without abnormal findings: Secondary | ICD-10-CM | POA: Diagnosis not present

## 2018-02-09 MED FILL — AZITHROMYCIN 250 MG TABLET: 250 | 5 days supply | Qty: 6 | Fill #0

## 2018-02-09 MED FILL — FLUCONAZOLE 150 MG TABS: 150 | 1 days supply | Qty: 1 | Fill #0

## 2018-02-20 MED FILL — VALACYCLOVIR HCL 500 MG TAB: 500 | 90 days supply | Qty: 90 | Fill #1

## 2018-03-11 MED FILL — metFORMIN HCL 500 MG TABS: 500 | 90 days supply | Qty: 180 | Fill #0

## 2018-03-11 MED FILL — buPROPion HCL ER (XL) 150 M: 150 | 90 days supply | Qty: 90 | Fill #0

## 2018-03-23 DIAGNOSIS — J069 Acute upper respiratory infection, unspecified: Secondary | ICD-10-CM | POA: Diagnosis not present

## 2018-03-24 ENCOUNTER — Other Ambulatory Visit: Payer: Self-pay | Admitting: Family Medicine

## 2018-03-24 DIAGNOSIS — J101 Influenza due to other identified influenza virus with other respiratory manifestations: Secondary | ICD-10-CM

## 2018-03-24 MED ORDER — OSELTAMIVIR PHOSPHATE 75 MG PO CAPS: 75.0000 mg | ORAL_CAPSULE | Freq: Two times a day (BID) | ORAL | 0 refills | Status: AC

## 2018-03-24 MED FILL — OSELTAMIVIR PHOSPHATE 75 MG: 75 | 5 days supply | Qty: 10 | Fill #0

## 2018-03-24 NOTE — Progress Notes (Signed)
Meds ordered this encounter  °Medications  ° oseltamivir (TAMIFLU) 75 MG capsule  °  Sig: Take 1 capsule (75 mg total) by mouth 2 (two) times daily for 5 days.  °  Dispense:  10 capsule  °  Refill:  0  °  °

## 2018-04-03 ENCOUNTER — Ambulatory Visit
Admission: RE | Admit: 2018-04-03 | Discharge: 2018-04-03 | Disposition: A | Discharge status: Home / Self Care | Payer: 59 | Source: Ambulatory Visit | Attending: Internal Medicine | Admitting: Internal Medicine

## 2018-04-03 DIAGNOSIS — Z1231 Encounter for screening mammogram for malignant neoplasm of breast: Secondary | ICD-10-CM | POA: Diagnosis not present

## 2018-04-27 DIAGNOSIS — F4323 Adjustment disorder with mixed anxiety and depressed mood: Secondary | ICD-10-CM | POA: Diagnosis not present

## 2018-04-29 MED FILL — VALACYCLOVIR HCL 500 MG TAB: 500 | 90 days supply | Qty: 90 | Fill #0

## 2018-05-15 DIAGNOSIS — F4323 Adjustment disorder with mixed anxiety and depressed mood: Secondary | ICD-10-CM | POA: Diagnosis not present

## 2018-05-25 DIAGNOSIS — Z6829 Body mass index (BMI) 29.0-29.9, adult: Secondary | ICD-10-CM | POA: Diagnosis not present

## 2018-05-25 DIAGNOSIS — Z01419 Encounter for gynecological examination (general) (routine) without abnormal findings: Secondary | ICD-10-CM | POA: Diagnosis not present

## 2018-05-25 MED FILL — ESTRADIOL 0.1 MG/GM CREA: 0.1 | 90 days supply | Qty: 43 | Fill #0

## 2018-06-01 DIAGNOSIS — F4323 Adjustment disorder with mixed anxiety and depressed mood: Secondary | ICD-10-CM | POA: Diagnosis not present

## 2018-06-08 DIAGNOSIS — B001 Herpesviral vesicular dermatitis: Secondary | ICD-10-CM | POA: Diagnosis not present

## 2018-06-08 DIAGNOSIS — F419 Anxiety disorder, unspecified: Secondary | ICD-10-CM | POA: Diagnosis not present

## 2018-06-08 MED FILL — VALACYCLOVIR HCL 500 MG TAB: 500 | 30 days supply | Qty: 45 | Fill #0

## 2018-06-08 MED FILL — buPROPion HCL ER (XL) 150 M: 150 | 90 days supply | Qty: 180 | Fill #0

## 2018-07-07 MED FILL — VALACYCLOVIR HCL 500 MG TAB: 500 | 30 days supply | Qty: 45 | Fill #1

## 2018-08-07 MED FILL — VALACYCLOVIR HCL 500 MG TAB: 500 | 30 days supply | Qty: 45 | Fill #2

## 2018-08-27 DIAGNOSIS — E282 Polycystic ovarian syndrome: Secondary | ICD-10-CM | POA: Diagnosis not present

## 2018-08-27 DIAGNOSIS — R7303 Prediabetes: Secondary | ICD-10-CM | POA: Diagnosis not present

## 2018-09-08 MED FILL — VALACYCLOVIR HCL 500 MG TAB: 500 | 30 days supply | Qty: 45 | Fill #3

## 2018-09-23 DIAGNOSIS — Z3009 Encounter for other general counseling and advice on contraception: Secondary | ICD-10-CM | POA: Diagnosis not present

## 2018-09-23 DIAGNOSIS — Z113 Encounter for screening for infections with a predominantly sexual mode of transmission: Secondary | ICD-10-CM | POA: Diagnosis not present

## 2018-09-23 DIAGNOSIS — Z3202 Encounter for pregnancy test, result negative: Secondary | ICD-10-CM | POA: Diagnosis not present

## 2018-09-23 DIAGNOSIS — N926 Irregular menstruation, unspecified: Secondary | ICD-10-CM | POA: Diagnosis not present

## 2018-10-09 MED FILL — VALACYCLOVIR HCL 500 MG TAB: 500 | 30 days supply | Qty: 45 | Fill #4

## 2018-10-19 MED FILL — buPROPion HCL ER (XL) 150 M: 150 | 90 days supply | Qty: 180 | Fill #1

## 2018-10-30 DIAGNOSIS — R102 Pelvic and perineal pain: Secondary | ICD-10-CM | POA: Diagnosis not present

## 2018-10-30 DIAGNOSIS — N800 Endometriosis of uterus: Secondary | ICD-10-CM | POA: Diagnosis not present

## 2018-10-30 MED FILL — LEVONO-E ESTRAD 0.10-0.02-0: 0.1-0.02 & | 91 days supply | Qty: 91 | Fill #0

## 2018-11-09 ENCOUNTER — Ambulatory Visit (HOSPITAL_COMMUNITY)
Admission: EM | Admit: 2018-11-09 | Discharge: 2018-11-09 | Disposition: A | Discharge status: Home / Self Care | Payer: 59 | Attending: Urgent Care | Admitting: Urgent Care

## 2018-11-09 ENCOUNTER — Encounter (HOSPITAL_COMMUNITY): Payer: Self-pay | Admitting: Emergency Medicine

## 2018-11-09 ENCOUNTER — Other Ambulatory Visit: Payer: Self-pay

## 2018-11-09 DIAGNOSIS — N3090 Cystitis, unspecified without hematuria: Secondary | ICD-10-CM | POA: Diagnosis not present

## 2018-11-09 DIAGNOSIS — R350 Frequency of micturition: Secondary | ICD-10-CM

## 2018-11-09 DIAGNOSIS — R300 Dysuria: Secondary | ICD-10-CM

## 2018-11-09 DIAGNOSIS — N809 Endometriosis, unspecified: Secondary | ICD-10-CM

## 2018-11-09 LAB — POCT URINALYSIS DIP (DEVICE)
Bilirubin Urine: NEGATIVE
Glucose, UA: NEGATIVE mg/dL
Ketones, ur: NEGATIVE mg/dL
Nitrite: NEGATIVE
Protein, ur: 30 mg/dL — AB
Specific Gravity, Urine: 1.015 (ref 1.005–1.030)
Urobilinogen, UA: 0.2 mg/dL (ref 0.0–1.0)
pH: 7 (ref 5.0–8.0)

## 2018-11-09 MED ORDER — NITROFURANTOIN MONOHYD MACRO 100 MG PO CAPS: 100.0000 mg | ORAL_CAPSULE | Freq: Two times a day (BID) | ORAL | 0 refills | Status: DC

## 2018-11-09 MED ORDER — FLUCONAZOLE 150 MG PO TABS: 150.0000 mg | ORAL_TABLET | ORAL | 0 refills | Status: DC

## 2018-11-09 NOTE — ED Triage Notes (Signed)
Pt here for dysuria and frequency x 2 days  

## 2018-11-09 NOTE — ED Provider Notes (Signed)
MRN: [id number] DOB: 1976-01-07  Subjective:   Andrea Young is a 43 y.o. female presenting for 2-day history of mild urinary discomfort, increased urinary frequency.  Patient does not have a history of frequent UTIs, has had 1 or 2 in the past.  Previously she did get Bactrim with good response but had a yeast infection.  She also has a history of endometriosis, PCOS.  Has never had to see her urologist.   No current facility-administered medications for this encounter.   Current Outpatient Medications:  .  acetaminophen (TYLENOL) 325 MG tablet, Take 325 mg by mouth every 6 (six) hours as needed for mild pain, moderate pain or headache. , Disp: , Rfl:  .  cholecalciferol (VITAMIN D) 1000 units tablet, Take 4,000 Units by mouth daily., Disp: , Rfl:  .  docusate sodium (COLACE) 100 MG capsule, Take 100 mg by mouth 2 (two) times daily as needed for mild constipation. , Disp: , Rfl:  .  ibuprofen (ADVIL,MOTRIN) 600 MG tablet, Take 1 tablet (600 mg total) by mouth every 6 (six) hours. (Patient not taking: Reported on 01/01/2017), Disp: 30 tablet, Rfl: 0 .  metFORMIN (GLUCOPHAGE) 500 MG tablet, Take 500 mg 2 (two) times daily with a meal by mouth., Disp: , Rfl:  .  penciclovir (DENAVIR) 1 % cream, Apply 1 application topically every 2 (two) hours as needed (for cold sores)., Disp: , Rfl:  .  Prenatal Vit-Fe Fumarate-FA (PRENATAL MULTIVITAMIN) TABS tablet, Take 1 tablet by mouth daily. , Disp: , Rfl:  .  valACYclovir (VALTREX) 500 MG tablet, Take 500 mg by mouth daily., Disp: , Rfl:     No Known Allergies   Past Medical History:  Diagnosis Date  . Anemia    history  . Complication of anesthesia   . Gestational diabetes   . PCOS (polycystic ovarian syndrome)    treatment with metformin  . PONV (postoperative nausea and vomiting)      Past Surgical History:  Procedure Laterality Date  . bilateral foot surgery    . CERVICAL CERCLAGE N/A 06/19/2015   Procedure: CERCLAGE CERVICAL;   Surgeon: Marcelle Overlie, MD;  Location: [facility];  Service: Gynecology;  Laterality: N/A;  . CESAREAN SECTION N/A 09/26/2014   Procedure: CESAREAN SECTION;  Surgeon: Marcelle Overlie, MD;  Location: [facility];  Service: Obstetrics;  Laterality: N/A;  . CESAREAN SECTION N/A 11/05/2015   Procedure: CESAREAN SECTION;  Surgeon: Mitchel Honour, DO;  Location: [facility];  Service: Obstetrics;  Laterality: N/A;  . DILATATION & CURRETTAGE/HYSTEROSCOPY WITH RESECTOCOPE  02/03/2012   Procedure: DILATATION & CURETTAGE/HYSTEROSCOPY WITH RESECTOCOPE;  Surgeon: Leslie Andrea, MD;  Location: [facility];  Service: Gynecology;  Laterality: N/A;  no resection done  . DILATION AND CURETTAGE OF UTERUS     x 2 for polyp removal  . HAND SURGERY     as a child -tendons & ligaments repair  . polypectomy    . WISDOM TOOTH EXTRACTION      ROS  Objective:   Vitals: BP 122/72 (BP Location: Left Arm)   Pulse 62   Temp 98.2 F (36.8 C) (Temporal)   Resp 16   SpO2 99%   Physical Exam Constitutional:      General: She is not in acute distress.    Appearance: Normal appearance. She is well-developed. She is not ill-appearing.  HENT:     Head: Normocephalic and atraumatic.     Nose: Nose normal.     Mouth/Throat:  Mouth: Mucous membranes are moist.     Pharynx: Oropharynx is clear.  Eyes:     General: No scleral icterus.    Extraocular Movements: Extraocular movements intact.     Pupils: Pupils are equal, round, and reactive to light.  Cardiovascular:     Rate and Rhythm: Normal rate.  Pulmonary:     Effort: Pulmonary effort is normal.  Abdominal:     General: There is no distension.     Palpations: Abdomen is soft. There is no mass.     Tenderness: There is no abdominal tenderness. There is no guarding or rebound.  Skin:    General: Skin is warm and dry.  Neurological:     General: No focal deficit present.     Mental Status: She is alert and oriented to person, place, and time.  Psychiatric:         Mood and Affect: Mood normal.        Behavior: Behavior normal.        Thought Content: Thought content normal.        Judgment: Judgment normal.     Results for orders placed or performed during the hospital encounter of 11/09/18 (from the past 24 hour(s))  POCT urinalysis dip (device)     Status: Abnormal   Collection Time: 11/09/18  8:21 PM  Result Value Ref Range   Glucose, UA NEGATIVE NEGATIVE mg/dL   Bilirubin Urine NEGATIVE NEGATIVE   Ketones, ur NEGATIVE NEGATIVE mg/dL   Specific Gravity, Urine 1.015 1.005 - 1.030   Hgb urine dipstick MODERATE (A) NEGATIVE   pH 7.0 5.0 - 8.0   Protein, ur 30 (A) NEGATIVE mg/dL   Urobilinogen, UA 0.2 0.0 - 1.0 mg/dL   Nitrite NEGATIVE NEGATIVE   Leukocytes,Ua SMALL (A) NEGATIVE    Assessment and Plan :   1. Cystitis   2. Dysuria   3. Urinary frequency   4. Endometriosis     Will have patient start Macrobid, urine culture is pending. Offered patient Bactrim but she is ok with trying Macrobid. Use diflucan for antibiotic associated yeast infection. Counseled patient on potential for adverse effects with medications prescribed/recommended today, ER and return-to-clinic precautions discussed, patient verbalized understanding.    Jaynee Eagles, [place] 11/10/18 [phone]

## 2018-11-18 MED FILL — VALACYCLOVIR HCL 500 MG TAB: 500 | 30 days supply | Qty: 45 | Fill #5

## 2018-11-19 MED FILL — metFORMIN HCL 500 MG TABS: 500 | 90 days supply | Qty: 180 | Fill #1

## 2018-12-02 DIAGNOSIS — Z0000 Encounter for general adult medical examination without abnormal findings: Secondary | ICD-10-CM | POA: Diagnosis not present

## 2018-12-02 DIAGNOSIS — F419 Anxiety disorder, unspecified: Secondary | ICD-10-CM | POA: Diagnosis not present

## 2018-12-02 DIAGNOSIS — R7303 Prediabetes: Secondary | ICD-10-CM | POA: Diagnosis not present

## 2018-12-02 DIAGNOSIS — E559 Vitamin D deficiency, unspecified: Secondary | ICD-10-CM | POA: Diagnosis not present

## 2018-12-02 DIAGNOSIS — E282 Polycystic ovarian syndrome: Secondary | ICD-10-CM | POA: Diagnosis not present

## 2018-12-02 DIAGNOSIS — Z1389 Encounter for screening for other disorder: Secondary | ICD-10-CM | POA: Diagnosis not present

## 2018-12-07 DIAGNOSIS — L680 Hirsutism: Secondary | ICD-10-CM | POA: Diagnosis not present

## 2018-12-07 MED FILL — SLYND 4 MG TABS: 4 | 28 days supply | Qty: 28 | Fill #0

## 2018-12-30 MED FILL — VALACYCLOVIR HCL 500 MG TAB: 500 | 30 days supply | Qty: 45 | Fill #0

## 2018-12-30 MED FILL — SLYND 4 MG TABS: 4 | 28 days supply | Qty: 28 | Fill #1

## 2019-01-12 DIAGNOSIS — R5383 Other fatigue: Secondary | ICD-10-CM | POA: Diagnosis not present

## 2019-01-12 DIAGNOSIS — J029 Acute pharyngitis, unspecified: Secondary | ICD-10-CM | POA: Diagnosis not present

## 2019-01-13 ENCOUNTER — Other Ambulatory Visit: Payer: Self-pay

## 2019-01-13 DIAGNOSIS — R5383 Other fatigue: Secondary | ICD-10-CM | POA: Diagnosis not present

## 2019-01-13 DIAGNOSIS — Z20822 Contact with and (suspected) exposure to covid-19: Secondary | ICD-10-CM

## 2019-01-15 LAB — NOVEL CORONAVIRUS, NAA: SARS-CoV-2, NAA: NOT DETECTED

## 2019-01-25 MED FILL — SLYND 4 MG TABS: 4 | 28 days supply | Qty: 28 | Fill #2

## 2019-01-27 DIAGNOSIS — N800 Endometriosis of uterus: Secondary | ICD-10-CM | POA: Diagnosis not present

## 2019-02-04 MED FILL — VALACYCLOVIR HCL 500 MG TAB: 500 | 45 days supply | Qty: 45 | Fill #1

## 2019-02-10 ENCOUNTER — Other Ambulatory Visit: Payer: 59

## 2019-03-12 MED FILL — VALACYCLOVIR HCL 500 MG TAB: 500 | 30 days supply | Qty: 45 | Fill #2

## 2019-04-15 ENCOUNTER — Other Ambulatory Visit: Payer: Self-pay | Admitting: Internal Medicine

## 2019-04-15 DIAGNOSIS — Z1231 Encounter for screening mammogram for malignant neoplasm of breast: Secondary | ICD-10-CM

## 2019-04-21 MED FILL — VALACYCLOVIR HCL 500 MG TAB: 500 | 30 days supply | Qty: 45 | Fill #3

## 2019-04-23 DIAGNOSIS — N760 Acute vaginitis: Secondary | ICD-10-CM | POA: Diagnosis not present

## 2019-04-23 DIAGNOSIS — L259 Unspecified contact dermatitis, unspecified cause: Secondary | ICD-10-CM | POA: Diagnosis not present

## 2019-04-26 DIAGNOSIS — J019 Acute sinusitis, unspecified: Secondary | ICD-10-CM | POA: Diagnosis not present

## 2019-04-26 MED FILL — AMOX-CLAV 875-125 MG TABLET: 875-125 | 7 days supply | Qty: 14 | Fill #0

## 2019-04-29 MED FILL — buPROPion HCL ER (XL) 150 M: 150 | 90 days supply | Qty: 180 | Fill #3

## 2019-04-29 MED FILL — METFORMIN HCL 500 MG TABS: 500 | 90 days supply | Qty: 180 | Fill #0

## 2019-05-19 ENCOUNTER — Ambulatory Visit: Payer: 59

## 2019-05-25 MED FILL — VALACYCLOVIR HCL 500 MG TAB: 500 | 30 days supply | Qty: 45 | Fill #4

## 2019-05-26 ENCOUNTER — Ambulatory Visit: Payer: 59

## 2019-07-07 MED FILL — SLYND 4 MG TABS: 4 | 28 days supply | Qty: 28 | Fill #3

## 2019-08-06 MED FILL — METFORMIN HCL 500 MG TABS: 500 | 90 days supply | Qty: 180 | Fill #1

## 2019-08-06 MED FILL — VALACYCLOVIR HCL 500 MG TAB: 500 | 30 days supply | Qty: 45 | Fill #1

## 2019-08-06 MED FILL — buPROPion HCL ER (XL) 150 M: 150 | 90 days supply | Qty: 180 | Fill #0

## 2019-08-17 DIAGNOSIS — Z6827 Body mass index (BMI) 27.0-27.9, adult: Secondary | ICD-10-CM | POA: Diagnosis not present

## 2019-08-17 DIAGNOSIS — Z113 Encounter for screening for infections with a predominantly sexual mode of transmission: Secondary | ICD-10-CM | POA: Diagnosis not present

## 2019-08-17 DIAGNOSIS — Z01419 Encounter for gynecological examination (general) (routine) without abnormal findings: Secondary | ICD-10-CM | POA: Diagnosis not present

## 2019-08-23 MED FILL — SLYND 4 MG TABS: 4 | 28 days supply | Qty: 28 | Fill #4

## 2019-08-27 DIAGNOSIS — Z1231 Encounter for screening mammogram for malignant neoplasm of breast: Secondary | ICD-10-CM | POA: Diagnosis not present

## 2019-09-10 MED FILL — VALACYCLOVIR HCL 500 MG TAB: 500 | 30 days supply | Qty: 45 | Fill #2

## 2019-09-25 MED FILL — SLYND 4 MG TABS: 4 | 28 days supply | Qty: 28 | Fill #5

## 2019-10-05 MED FILL — SLYND 4 MG TABS: 4 | 28 days supply | Qty: 28 | Fill #5

## 2019-10-18 MED FILL — VALACYCLOVIR HCL 500 MG TAB: 500 | 30 days supply | Qty: 45 | Fill #3

## 2019-11-15 ENCOUNTER — Other Ambulatory Visit (HOSPITAL_COMMUNITY): Payer: Self-pay | Admitting: Obstetrics and Gynecology

## 2019-11-15 MED FILL — buPROPion HCL ER (XL) 150 M: 150 | 90 days supply | Qty: 180 | Fill #1

## 2019-11-15 MED FILL — METFORMIN HCL 500 MG TABS: 500 | 90 days supply | Qty: 180 | Fill #2

## 2019-11-15 MED FILL — VALACYCLOVIR HCL 500 MG TAB: 500 | 30 days supply | Qty: 45 | Fill #4

## 2019-11-16 MED FILL — SLYND 4 MG TABS: 4 | 24 days supply | Qty: 28 | Fill #0

## 2019-12-09 ENCOUNTER — Other Ambulatory Visit (HOSPITAL_COMMUNITY): Payer: Self-pay | Admitting: Internal Medicine

## 2019-12-09 DIAGNOSIS — H00016 Hordeolum externum left eye, unspecified eyelid: Secondary | ICD-10-CM | POA: Diagnosis not present

## 2019-12-09 DIAGNOSIS — H01006 Unspecified blepharitis left eye, unspecified eyelid: Secondary | ICD-10-CM | POA: Diagnosis not present

## 2019-12-09 MED FILL — DOXYCYCLINE HYCLATE 100 MG: 100 | 7 days supply | Qty: 14 | Fill #0

## 2019-12-22 ENCOUNTER — Other Ambulatory Visit (HOSPITAL_COMMUNITY): Payer: Self-pay | Admitting: Internal Medicine

## 2019-12-22 MED FILL — FLUCONAZOLE 150 MG TABS: 150 | 10 days supply | Qty: 1 | Fill #0

## 2019-12-24 ENCOUNTER — Other Ambulatory Visit (HOSPITAL_COMMUNITY): Payer: Self-pay | Admitting: Internal Medicine

## 2019-12-24 MED FILL — VALACYCLOVIR HCL 500 MG TAB: 500 | 30 days supply | Qty: 45 | Fill #0

## 2019-12-27 ENCOUNTER — Other Ambulatory Visit (HOSPITAL_COMMUNITY): Payer: Self-pay | Admitting: Internal Medicine

## 2019-12-27 DIAGNOSIS — H00016 Hordeolum externum left eye, unspecified eyelid: Secondary | ICD-10-CM | POA: Diagnosis not present

## 2019-12-27 MED FILL — DOXYCYCLINE HYCLATE 100 MG: 100 | 7 days supply | Qty: 14 | Fill #0

## 2020-01-02 MED FILL — SLYND 4 MG TABS: 4 | 24 days supply | Qty: 28 | Fill #1

## 2020-01-20 ENCOUNTER — Other Ambulatory Visit (HOSPITAL_COMMUNITY): Payer: Self-pay | Admitting: Surgery

## 2020-01-20 DIAGNOSIS — H00024 Hordeolum internum left upper eyelid: Secondary | ICD-10-CM | POA: Diagnosis not present

## 2020-01-20 DIAGNOSIS — H0102B Squamous blepharitis left eye, upper and lower eyelids: Secondary | ICD-10-CM | POA: Diagnosis not present

## 2020-01-20 DIAGNOSIS — H00025 Hordeolum internum left lower eyelid: Secondary | ICD-10-CM | POA: Diagnosis not present

## 2020-01-20 DIAGNOSIS — H0102A Squamous blepharitis right eye, upper and lower eyelids: Secondary | ICD-10-CM | POA: Diagnosis not present

## 2020-01-20 MED FILL — NEO/POLY/DEXAMET EYE OINT: 3.5-10000-0 | 3 days supply | Qty: 4 | Fill #0

## 2020-01-31 MED FILL — VALACYCLOVIR HCL 500 MG TAB: 500 | 30 days supply | Qty: 45 | Fill #1

## 2020-01-31 MED FILL — SLYND 4 MG TABS: 4 | 24 days supply | Qty: 28 | Fill #2

## 2020-02-03 DIAGNOSIS — H0102B Squamous blepharitis left eye, upper and lower eyelids: Secondary | ICD-10-CM | POA: Diagnosis not present

## 2020-02-03 DIAGNOSIS — H0102A Squamous blepharitis right eye, upper and lower eyelids: Secondary | ICD-10-CM | POA: Diagnosis not present

## 2020-02-03 DIAGNOSIS — H00025 Hordeolum internum left lower eyelid: Secondary | ICD-10-CM | POA: Diagnosis not present

## 2020-02-03 DIAGNOSIS — H00024 Hordeolum internum left upper eyelid: Secondary | ICD-10-CM | POA: Diagnosis not present

## 2020-02-04 ENCOUNTER — Other Ambulatory Visit (HOSPITAL_COMMUNITY): Payer: Self-pay | Admitting: Endocrinology

## 2020-02-04 DIAGNOSIS — R7303 Prediabetes: Secondary | ICD-10-CM | POA: Diagnosis not present

## 2020-02-04 DIAGNOSIS — E282 Polycystic ovarian syndrome: Secondary | ICD-10-CM | POA: Diagnosis not present

## 2020-02-04 DIAGNOSIS — E559 Vitamin D deficiency, unspecified: Secondary | ICD-10-CM | POA: Diagnosis not present

## 2020-02-04 DIAGNOSIS — N800 Endometriosis of uterus: Secondary | ICD-10-CM | POA: Diagnosis not present

## 2020-02-04 DIAGNOSIS — N926 Irregular menstruation, unspecified: Secondary | ICD-10-CM | POA: Diagnosis not present

## 2020-02-04 DIAGNOSIS — Z8632 Personal history of gestational diabetes: Secondary | ICD-10-CM | POA: Diagnosis not present

## 2020-02-04 MED FILL — metFORMIN HCL ER 500 MG TB2: 500 | 90 days supply | Qty: 180 | Fill #0

## 2020-02-04 MED FILL — SPIRONOLACTONE 50 MG TABLET: 50 | 90 days supply | Qty: 90 | Fill #0

## 2020-02-23 ENCOUNTER — Other Ambulatory Visit (HOSPITAL_COMMUNITY): Payer: Self-pay | Admitting: Internal Medicine

## 2020-02-23 MED FILL — buPROPion HCL ER (XL) 150 M: 150 | 90 days supply | Qty: 180 | Fill #0

## 2020-03-05 MED FILL — SLYND 4 MG TABS: 4 | 24 days supply | Qty: 28 | Fill #3

## 2020-03-10 MED FILL — VALACYCLOVIR HCL 500 MG TAB: 500 | 30 days supply | Qty: 45 | Fill #2

## 2020-03-15 DIAGNOSIS — F419 Anxiety disorder, unspecified: Secondary | ICD-10-CM | POA: Diagnosis not present

## 2020-03-15 DIAGNOSIS — R7303 Prediabetes: Secondary | ICD-10-CM | POA: Diagnosis not present

## 2020-03-15 DIAGNOSIS — Z0000 Encounter for general adult medical examination without abnormal findings: Secondary | ICD-10-CM | POA: Diagnosis not present

## 2020-03-15 DIAGNOSIS — E559 Vitamin D deficiency, unspecified: Secondary | ICD-10-CM | POA: Diagnosis not present

## 2020-03-15 DIAGNOSIS — Z1389 Encounter for screening for other disorder: Secondary | ICD-10-CM | POA: Diagnosis not present

## 2020-03-15 DIAGNOSIS — E282 Polycystic ovarian syndrome: Secondary | ICD-10-CM | POA: Diagnosis not present

## 2020-04-04 MED FILL — SLYND 4 MG TABS: 4 | 24 days supply | Qty: 28 | Fill #4

## 2020-04-17 MED FILL — VALACYCLOVIR HCL 500 MG TAB: 500 | 30 days supply | Qty: 45 | Fill #3

## 2020-05-08 MED FILL — SPIRONOLACTONE 50 MG TABLET: 50 | 90 days supply | Qty: 90 | Fill #1

## 2020-05-09 MED FILL — metFORMIN HCL ER 500 MG TB2: 500 | 90 days supply | Qty: 180 | Fill #1

## 2020-05-15 ENCOUNTER — Other Ambulatory Visit (HOSPITAL_COMMUNITY): Payer: Self-pay | Admitting: *Deleted

## 2020-05-22 ENCOUNTER — Other Ambulatory Visit: Payer: Self-pay

## 2020-05-22 ENCOUNTER — Ambulatory Visit (HOSPITAL_BASED_OUTPATIENT_CLINIC_OR_DEPARTMENT_OTHER)
Admission: RE | Admit: 2020-05-22 | Discharge: 2020-05-22 | Disposition: A | Discharge status: Home / Self Care | Payer: 59 | Source: Ambulatory Visit | Attending: Cardiology | Admitting: Cardiology

## 2020-05-26 ENCOUNTER — Other Ambulatory Visit (HOSPITAL_COMMUNITY): Payer: Self-pay

## 2020-05-26 MED FILL — Drospirenone Tab 4 MG: ORAL | 28 days supply | Qty: 28 | Fill #0 | Status: AC

## 2020-05-26 MED FILL — Valacyclovir HCl Tab 500 MG: ORAL | 30 days supply | Qty: 45 | Fill #0 | Status: AC

## 2020-05-26 MED FILL — Bupropion HCl Tab ER 24HR 150 MG: ORAL | 90 days supply | Qty: 180 | Fill #0 | Status: AC

## 2020-05-31 ENCOUNTER — Other Ambulatory Visit (HOSPITAL_COMMUNITY): Payer: Self-pay

## 2020-06-12 ENCOUNTER — Other Ambulatory Visit (HOSPITAL_COMMUNITY): Payer: Self-pay

## 2020-06-12 DIAGNOSIS — N760 Acute vaginitis: Secondary | ICD-10-CM | POA: Diagnosis not present

## 2020-06-12 DIAGNOSIS — Z113 Encounter for screening for infections with a predominantly sexual mode of transmission: Secondary | ICD-10-CM | POA: Diagnosis not present

## 2020-06-12 MED ORDER — FLUCONAZOLE 150 MG PO TABS
ORAL_TABLET | ORAL | 1 refills | Status: DC
  Filled 2020-06-12: qty 3, 3d supply, fill #0

## 2020-06-26 ENCOUNTER — Other Ambulatory Visit (HOSPITAL_COMMUNITY): Payer: Self-pay

## 2020-06-26 ENCOUNTER — Other Ambulatory Visit: Payer: Self-pay | Admitting: Internal Medicine

## 2020-06-26 DIAGNOSIS — J0101 Acute recurrent maxillary sinusitis: Secondary | ICD-10-CM | POA: Diagnosis not present

## 2020-06-26 MED ORDER — AZITHROMYCIN 250 MG PO TABS
ORAL_TABLET | ORAL | 0 refills | Status: DC
  Filled 2020-06-26: qty 6, 5d supply, fill #0

## 2020-06-26 MED FILL — Drospirenone Tab 4 MG: ORAL | 28 days supply | Qty: 28 | Fill #1 | Status: AC

## 2020-06-27 ENCOUNTER — Other Ambulatory Visit (HOSPITAL_COMMUNITY): Payer: Self-pay

## 2020-06-28 ENCOUNTER — Other Ambulatory Visit (HOSPITAL_COMMUNITY): Payer: Self-pay

## 2020-06-28 ENCOUNTER — Other Ambulatory Visit: Payer: Self-pay | Admitting: Internal Medicine

## 2020-06-29 ENCOUNTER — Other Ambulatory Visit (HOSPITAL_COMMUNITY): Payer: Self-pay

## 2020-07-04 ENCOUNTER — Other Ambulatory Visit (HOSPITAL_COMMUNITY): Payer: Self-pay

## 2020-07-04 ENCOUNTER — Other Ambulatory Visit: Payer: Self-pay | Admitting: Internal Medicine

## 2020-07-05 ENCOUNTER — Other Ambulatory Visit (HOSPITAL_COMMUNITY): Payer: Self-pay

## 2020-07-06 ENCOUNTER — Other Ambulatory Visit: Payer: Self-pay

## 2020-07-06 ENCOUNTER — Other Ambulatory Visit (HOSPITAL_COMMUNITY): Payer: Self-pay

## 2020-07-07 ENCOUNTER — Other Ambulatory Visit (HOSPITAL_COMMUNITY): Payer: Self-pay

## 2020-07-07 MED ORDER — VALACYCLOVIR HCL 500 MG PO TABS
ORAL_TABLET | ORAL | 4 refills | Status: DC
  Filled 2020-07-07: qty 45, 35d supply, fill #0
  Filled 2020-08-16: qty 45, 30d supply, fill #1
  Filled 2020-09-14: qty 45, 20d supply, fill #2
  Filled 2020-10-20: qty 45, 30d supply, fill #3
  Filled 2020-11-22: qty 45, 30d supply, fill #4

## 2020-07-24 ENCOUNTER — Other Ambulatory Visit (HOSPITAL_COMMUNITY): Payer: Self-pay

## 2020-07-24 MED FILL — Drospirenone Tab 4 MG: ORAL | 28 days supply | Qty: 28 | Fill #2 | Status: AC

## 2020-08-16 ENCOUNTER — Other Ambulatory Visit (HOSPITAL_COMMUNITY): Payer: Self-pay

## 2020-08-16 MED FILL — Metformin HCl Tab ER 24HR 500 MG: ORAL | 90 days supply | Qty: 180 | Fill #0 | Status: AC

## 2020-08-16 MED FILL — Drospirenone Tab 4 MG: ORAL | 24 days supply | Qty: 28 | Fill #3 | Status: AC

## 2020-08-16 MED FILL — Spironolactone Tab 50 MG: ORAL | 90 days supply | Qty: 90 | Fill #0 | Status: AC

## 2020-08-30 ENCOUNTER — Other Ambulatory Visit (HOSPITAL_COMMUNITY): Payer: Self-pay

## 2020-08-30 DIAGNOSIS — Z113 Encounter for screening for infections with a predominantly sexual mode of transmission: Secondary | ICD-10-CM | POA: Diagnosis not present

## 2020-08-30 DIAGNOSIS — N760 Acute vaginitis: Secondary | ICD-10-CM | POA: Diagnosis not present

## 2020-08-30 MED ORDER — FLUCONAZOLE 150 MG PO TABS
ORAL_TABLET | ORAL | 1 refills | Status: DC
  Filled 2020-08-30: qty 2, 3d supply, fill #0
  Filled 2020-09-02: qty 2, 3d supply, fill #1

## 2020-09-02 ENCOUNTER — Other Ambulatory Visit (HOSPITAL_COMMUNITY): Payer: Self-pay

## 2020-09-08 ENCOUNTER — Other Ambulatory Visit (HOSPITAL_COMMUNITY): Payer: Self-pay

## 2020-09-08 DIAGNOSIS — Z01419 Encounter for gynecological examination (general) (routine) without abnormal findings: Secondary | ICD-10-CM | POA: Diagnosis not present

## 2020-09-08 DIAGNOSIS — N898 Other specified noninflammatory disorders of vagina: Secondary | ICD-10-CM | POA: Diagnosis not present

## 2020-09-08 DIAGNOSIS — Z6827 Body mass index (BMI) 27.0-27.9, adult: Secondary | ICD-10-CM | POA: Diagnosis not present

## 2020-09-08 DIAGNOSIS — Z1231 Encounter for screening mammogram for malignant neoplasm of breast: Secondary | ICD-10-CM | POA: Diagnosis not present

## 2020-09-08 DIAGNOSIS — Z760 Encounter for issue of repeat prescription: Secondary | ICD-10-CM | POA: Diagnosis not present

## 2020-09-08 MED ORDER — SLYND 4 MG PO TABS
ORAL_TABLET | ORAL | 3 refills | Status: DC
  Filled 2020-09-08 – 2020-09-29 (×3): qty 84, 72d supply, fill #0
  Filled 2020-12-22: qty 84, 72d supply, fill #1
  Filled 2021-04-02: qty 84, 72d supply, fill #2
  Filled 2021-06-02: qty 84, 72d supply, fill #3

## 2020-09-14 ENCOUNTER — Other Ambulatory Visit (HOSPITAL_COMMUNITY): Payer: Self-pay

## 2020-09-18 ENCOUNTER — Other Ambulatory Visit: Payer: Self-pay

## 2020-09-18 ENCOUNTER — Other Ambulatory Visit (HOSPITAL_COMMUNITY): Payer: Self-pay

## 2020-09-19 ENCOUNTER — Other Ambulatory Visit (HOSPITAL_COMMUNITY): Payer: Self-pay

## 2020-09-19 MED ORDER — BUPROPION HCL ER (XL) 150 MG PO TB24
ORAL_TABLET | ORAL | 2 refills | Status: DC
  Filled 2020-09-19: qty 180, 90d supply, fill #0
  Filled 2020-12-21: qty 180, 90d supply, fill #1
  Filled 2021-04-02: qty 180, 90d supply, fill #2

## 2020-09-20 ENCOUNTER — Other Ambulatory Visit (HOSPITAL_COMMUNITY): Payer: Self-pay

## 2020-09-26 DIAGNOSIS — Z0184 Encounter for antibody response examination: Secondary | ICD-10-CM | POA: Diagnosis not present

## 2020-09-29 ENCOUNTER — Other Ambulatory Visit (HOSPITAL_COMMUNITY): Payer: Self-pay

## 2020-10-20 ENCOUNTER — Other Ambulatory Visit (HOSPITAL_COMMUNITY): Payer: Self-pay

## 2020-11-22 ENCOUNTER — Other Ambulatory Visit (HOSPITAL_COMMUNITY): Payer: Self-pay

## 2020-12-21 ENCOUNTER — Other Ambulatory Visit (HOSPITAL_COMMUNITY): Payer: Self-pay

## 2020-12-21 MED ORDER — VALACYCLOVIR HCL 500 MG PO TABS
ORAL_TABLET | ORAL | 4 refills | Status: DC
  Filled 2020-12-21: qty 45, 15d supply, fill #0
  Filled 2021-01-23: qty 45, 30d supply, fill #1
  Filled 2021-03-03: qty 45, 30d supply, fill #2
  Filled 2021-04-02: qty 45, 30d supply, fill #3
  Filled 2021-04-29: qty 45, 30d supply, fill #4

## 2020-12-21 MED FILL — Spironolactone Tab 50 MG: ORAL | 90 days supply | Qty: 90 | Fill #1 | Status: AC

## 2020-12-22 ENCOUNTER — Other Ambulatory Visit (HOSPITAL_COMMUNITY): Payer: Self-pay

## 2020-12-25 ENCOUNTER — Other Ambulatory Visit (HOSPITAL_COMMUNITY): Payer: Self-pay

## 2020-12-29 ENCOUNTER — Other Ambulatory Visit (HOSPITAL_COMMUNITY): Payer: Self-pay

## 2021-01-23 ENCOUNTER — Other Ambulatory Visit (HOSPITAL_COMMUNITY): Payer: Self-pay

## 2021-01-25 DIAGNOSIS — N941 Unspecified dyspareunia: Secondary | ICD-10-CM | POA: Diagnosis not present

## 2021-01-25 DIAGNOSIS — N898 Other specified noninflammatory disorders of vagina: Secondary | ICD-10-CM | POA: Diagnosis not present

## 2021-01-25 DIAGNOSIS — Z113 Encounter for screening for infections with a predominantly sexual mode of transmission: Secondary | ICD-10-CM | POA: Diagnosis not present

## 2021-02-02 ENCOUNTER — Other Ambulatory Visit (HOSPITAL_COMMUNITY): Payer: Self-pay

## 2021-02-02 DIAGNOSIS — Z7984 Long term (current) use of oral hypoglycemic drugs: Secondary | ICD-10-CM | POA: Diagnosis not present

## 2021-02-02 DIAGNOSIS — Z6828 Body mass index (BMI) 28.0-28.9, adult: Secondary | ICD-10-CM | POA: Diagnosis not present

## 2021-02-02 DIAGNOSIS — R7303 Prediabetes: Secondary | ICD-10-CM | POA: Diagnosis not present

## 2021-02-02 DIAGNOSIS — E282 Polycystic ovarian syndrome: Secondary | ICD-10-CM | POA: Diagnosis not present

## 2021-02-02 DIAGNOSIS — E559 Vitamin D deficiency, unspecified: Secondary | ICD-10-CM | POA: Diagnosis not present

## 2021-02-02 DIAGNOSIS — E663 Overweight: Secondary | ICD-10-CM | POA: Diagnosis not present

## 2021-02-02 DIAGNOSIS — L680 Hirsutism: Secondary | ICD-10-CM | POA: Diagnosis not present

## 2021-02-02 MED ORDER — OZEMPIC (0.25 OR 0.5 MG/DOSE) 2 MG/1.5ML ~~LOC~~ SOPN
PEN_INJECTOR | SUBCUTANEOUS | 11 refills | Status: AC
  Filled 2021-02-02: qty 1.5, 30d supply, fill #0
  Filled 2021-03-03: qty 1.5, 28d supply, fill #1
  Filled 2021-04-02: qty 1.5, 28d supply, fill #2
  Filled 2021-04-29: qty 1.5, 28d supply, fill #3
  Filled 2021-06-02 – 2022-01-25 (×5): qty 1.5, 28d supply, fill #4

## 2021-02-02 MED ORDER — SPIRONOLACTONE 100 MG PO TABS
ORAL_TABLET | ORAL | 4 refills | Status: DC
  Filled 2021-02-02: qty 90, 90d supply, fill #0
  Filled 2021-07-02: qty 52, 52d supply, fill #1
  Filled 2021-07-03: qty 38, 38d supply, fill #1
  Filled 2021-09-30: qty 90, 90d supply, fill #2
  Filled 2022-01-25: qty 90, 90d supply, fill #3

## 2021-03-03 ENCOUNTER — Other Ambulatory Visit (HOSPITAL_COMMUNITY): Payer: Self-pay

## 2021-03-16 DIAGNOSIS — K5900 Constipation, unspecified: Secondary | ICD-10-CM | POA: Diagnosis not present

## 2021-03-16 DIAGNOSIS — E559 Vitamin D deficiency, unspecified: Secondary | ICD-10-CM | POA: Diagnosis not present

## 2021-03-16 DIAGNOSIS — Z13220 Encounter for screening for lipoid disorders: Secondary | ICD-10-CM | POA: Diagnosis not present

## 2021-03-16 DIAGNOSIS — E282 Polycystic ovarian syndrome: Secondary | ICD-10-CM | POA: Diagnosis not present

## 2021-03-16 DIAGNOSIS — R7303 Prediabetes: Secondary | ICD-10-CM | POA: Diagnosis not present

## 2021-03-16 DIAGNOSIS — Z0000 Encounter for general adult medical examination without abnormal findings: Secondary | ICD-10-CM | POA: Diagnosis not present

## 2021-03-16 DIAGNOSIS — Z1389 Encounter for screening for other disorder: Secondary | ICD-10-CM | POA: Diagnosis not present

## 2021-04-02 ENCOUNTER — Other Ambulatory Visit (HOSPITAL_COMMUNITY): Payer: Self-pay

## 2021-04-29 ENCOUNTER — Other Ambulatory Visit (HOSPITAL_COMMUNITY): Payer: Self-pay

## 2021-04-30 ENCOUNTER — Other Ambulatory Visit (HOSPITAL_COMMUNITY): Payer: Self-pay

## 2021-05-02 ENCOUNTER — Other Ambulatory Visit (HOSPITAL_COMMUNITY): Payer: Self-pay

## 2021-06-02 ENCOUNTER — Other Ambulatory Visit (HOSPITAL_COMMUNITY): Payer: Self-pay

## 2021-06-04 ENCOUNTER — Other Ambulatory Visit (HOSPITAL_COMMUNITY): Payer: Self-pay

## 2021-06-04 MED ORDER — VALACYCLOVIR HCL 500 MG PO TABS
ORAL_TABLET | ORAL | 4 refills | Status: DC
  Filled 2021-06-04: qty 45, 30d supply, fill #0
  Filled 2021-07-02: qty 45, 30d supply, fill #1
  Filled 2021-08-07: qty 45, 30d supply, fill #2
  Filled 2021-09-30: qty 45, 30d supply, fill #3
  Filled 2021-11-26: qty 45, 30d supply, fill #4

## 2021-06-04 MED ORDER — OZEMPIC (0.25 OR 0.5 MG/DOSE) 2 MG/3ML ~~LOC~~ SOPN
PEN_INJECTOR | SUBCUTANEOUS | 7 refills | Status: AC
  Filled 2021-06-04: qty 3, 28d supply, fill #0
  Filled 2021-07-02: qty 3, 28d supply, fill #1
  Filled 2021-09-30: qty 3, 28d supply, fill #2
  Filled 2021-10-30: qty 3, 28d supply, fill #3
  Filled 2021-11-26: qty 3, 28d supply, fill #4
  Filled 2021-12-24: qty 3, 28d supply, fill #5
  Filled 2022-01-25: qty 3, 28d supply, fill #6

## 2021-07-02 ENCOUNTER — Other Ambulatory Visit (HOSPITAL_COMMUNITY): Payer: Self-pay

## 2021-07-02 MED ORDER — OZEMPIC (0.25 OR 0.5 MG/DOSE) 2 MG/3ML ~~LOC~~ SOPN
PEN_INJECTOR | SUBCUTANEOUS | 4 refills | Status: AC
  Filled 2021-07-02: qty 9, 84d supply, fill #0
  Filled 2021-08-08: qty 3, 28d supply, fill #0
  Filled 2021-09-03: qty 3, 28d supply, fill #1
  Filled 2022-02-26 – 2022-05-15 (×3): qty 3, 28d supply, fill #2

## 2021-07-02 MED ORDER — BUPROPION HCL ER (XL) 150 MG PO TB24
150.0000 mg | ORAL_TABLET | Freq: Two times a day (BID) | ORAL | 2 refills | Status: AC
  Filled 2021-07-02: qty 180, 90d supply, fill #0
  Filled 2021-09-30: qty 180, 90d supply, fill #1
  Filled 2022-01-25 – 2022-06-28 (×2): qty 180, 90d supply, fill #2

## 2021-07-03 ENCOUNTER — Other Ambulatory Visit (HOSPITAL_COMMUNITY): Payer: Self-pay

## 2021-08-07 ENCOUNTER — Other Ambulatory Visit (HOSPITAL_COMMUNITY): Payer: Self-pay

## 2021-08-08 ENCOUNTER — Other Ambulatory Visit (HOSPITAL_COMMUNITY): Payer: Self-pay

## 2021-08-09 ENCOUNTER — Other Ambulatory Visit (HOSPITAL_COMMUNITY): Payer: Self-pay

## 2021-08-09 DIAGNOSIS — Z113 Encounter for screening for infections with a predominantly sexual mode of transmission: Secondary | ICD-10-CM | POA: Diagnosis not present

## 2021-08-09 DIAGNOSIS — N760 Acute vaginitis: Secondary | ICD-10-CM | POA: Diagnosis not present

## 2021-08-09 MED ORDER — METRONIDAZOLE 500 MG PO TABS
ORAL_TABLET | ORAL | 0 refills | Status: DC
  Filled 2021-08-09: qty 14, 7d supply, fill #0

## 2021-08-14 DIAGNOSIS — L680 Hirsutism: Secondary | ICD-10-CM | POA: Diagnosis not present

## 2021-08-14 DIAGNOSIS — E559 Vitamin D deficiency, unspecified: Secondary | ICD-10-CM | POA: Diagnosis not present

## 2021-08-14 DIAGNOSIS — E282 Polycystic ovarian syndrome: Secondary | ICD-10-CM | POA: Diagnosis not present

## 2021-08-14 DIAGNOSIS — Z7985 Long-term (current) use of injectable non-insulin antidiabetic drugs: Secondary | ICD-10-CM | POA: Diagnosis not present

## 2021-08-14 DIAGNOSIS — R7303 Prediabetes: Secondary | ICD-10-CM | POA: Diagnosis not present

## 2021-09-01 ENCOUNTER — Other Ambulatory Visit (HOSPITAL_COMMUNITY): Payer: Self-pay

## 2021-09-03 ENCOUNTER — Other Ambulatory Visit (HOSPITAL_COMMUNITY): Payer: Self-pay

## 2021-09-03 MED ORDER — SLYND 4 MG PO TABS
ORAL_TABLET | ORAL | 0 refills | Status: DC
  Filled 2021-09-03: qty 84, 63d supply, fill #0

## 2021-09-06 DIAGNOSIS — E282 Polycystic ovarian syndrome: Secondary | ICD-10-CM | POA: Diagnosis not present

## 2021-09-06 DIAGNOSIS — D72819 Decreased white blood cell count, unspecified: Secondary | ICD-10-CM | POA: Diagnosis not present

## 2021-09-06 DIAGNOSIS — R251 Tremor, unspecified: Secondary | ICD-10-CM | POA: Diagnosis not present

## 2021-09-10 ENCOUNTER — Telehealth: Payer: Self-pay | Admitting: Hematology

## 2021-09-10 NOTE — Telephone Encounter (Signed)
Scheduled appt per 7/24 referral. Pt is aware of appt date and time. Pt is aware to arrive 15 mins prior to appt time and to bring and updated insurance card. Pt is aware of appt location.   

## 2021-09-14 DIAGNOSIS — E282 Polycystic ovarian syndrome: Secondary | ICD-10-CM | POA: Diagnosis not present

## 2021-10-01 ENCOUNTER — Other Ambulatory Visit (HOSPITAL_COMMUNITY): Payer: Self-pay

## 2021-10-03 ENCOUNTER — Other Ambulatory Visit: Payer: Self-pay

## 2021-10-03 ENCOUNTER — Inpatient Hospital Stay: Payer: 59

## 2021-10-03 ENCOUNTER — Inpatient Hospital Stay: Payer: 59 | Attending: Hematology | Admitting: Hematology

## 2021-10-03 VITALS — BP 110/68 | HR 64 | Temp 97.3°F | Resp 20 | Wt 151.6 lb

## 2021-10-03 DIAGNOSIS — Z803 Family history of malignant neoplasm of breast: Secondary | ICD-10-CM | POA: Insufficient documentation

## 2021-10-03 DIAGNOSIS — D72819 Decreased white blood cell count, unspecified: Secondary | ICD-10-CM

## 2021-10-03 LAB — CBC WITH DIFFERENTIAL/PLATELET
Abs Immature Granulocytes: 0.01 10*3/uL (ref 0.00–0.07)
Basophils Absolute: 0 10*3/uL (ref 0.0–0.1)
Basophils Relative: 1 %
Eosinophils Absolute: 0 10*3/uL (ref 0.0–0.5)
Eosinophils Relative: 0 %
HCT: 39.4 % (ref 36.0–46.0)
Hemoglobin: 13.6 g/dL (ref 12.0–15.0)
Immature Granulocytes: 0 %
Lymphocytes Relative: 41 %
Lymphs Abs: 1.4 10*3/uL (ref 0.7–4.0)
MCH: 31.1 pg (ref 26.0–34.0)
MCHC: 34.5 g/dL (ref 30.0–36.0)
MCV: 90.2 fL (ref 80.0–100.0)
Monocytes Absolute: 0.2 10*3/uL (ref 0.1–1.0)
Monocytes Relative: 5 %
Neutro Abs: 1.8 10*3/uL (ref 1.7–7.7)
Neutrophils Relative %: 53 %
Platelets: 303 10*3/uL (ref 150–400)
RBC: 4.37 MIL/uL (ref 3.87–5.11)
RDW: 12.2 % (ref 11.5–15.5)
WBC: 3.5 10*3/uL — ABNORMAL LOW (ref 4.0–10.5)
nRBC: 0 % (ref 0.0–0.2)

## 2021-10-03 LAB — CMP (CANCER CENTER ONLY)
ALT: 20 U/L (ref 0–44)
AST: 18 U/L (ref 15–41)
Albumin: 4.1 g/dL (ref 3.5–5.0)
Alkaline Phosphatase: 69 U/L (ref 38–126)
Anion gap: 7 (ref 5–15)
BUN: 14 mg/dL (ref 6–20)
CO2: 23 mmol/L (ref 22–32)
Calcium: 9.3 mg/dL (ref 8.9–10.3)
Chloride: 109 mmol/L (ref 98–111)
Creatinine: 1.14 mg/dL — ABNORMAL HIGH (ref 0.44–1.00)
GFR, Estimated: >60 mL/min (ref >60)
Glucose, Bld: 71 mg/dL (ref 70–99)
Potassium: 3.8 mmol/L (ref 3.5–5.1)
Sodium: 139 mmol/L (ref 135–145)
Total Bilirubin: 0.6 mg/dL (ref 0.3–1.2)
Total Protein: 8 g/dL (ref 6.5–8.1)

## 2021-10-03 LAB — IRON AND IRON BINDING CAPACITY (CC-WL,HP ONLY)
Iron: 84 ug/dL (ref 28–170)
Saturation Ratios: 25 % (ref 10.4–31.8)
TIBC: 343 ug/dL (ref 250–450)
UIBC: 259 ug/dL

## 2021-10-03 LAB — VITAMIN B12: Vitamin B-12: 169 pg/mL — ABNORMAL LOW (ref 180–914)

## 2021-10-03 LAB — HIV ANTIBODY (ROUTINE TESTING W REFLEX): HIV Screen 4th Generation wRfx: NONREACTIVE

## 2021-10-03 LAB — VITAMIN D 25 HYDROXY (VIT D DEFICIENCY, FRACTURES): Vit D, 25-Hydroxy: 33.32 ng/mL (ref 30–100)

## 2021-10-03 LAB — FERRITIN: Ferritin: 23 ng/mL (ref 11–307)

## 2021-10-03 LAB — SAVE SMEAR(SSMR), FOR PROVIDER SLIDE REVIEW

## 2021-10-03 LAB — LACTATE DEHYDROGENASE: LDH: 141 U/L (ref 98–192)

## 2021-10-03 LAB — HEPATITIS C ANTIBODY: HCV Ab: NONREACTIVE

## 2021-10-03 NOTE — Progress Notes (Addendum)
Marland Kitchen   HEMATOLOGY/ONCOLOGY CONSULTATION NOTE  Date of Service: 10/03/2021  Patient Care Team: Cain Saupe, MD as PCP - General (Family Medicine)  CHIEF COMPLAINTS/PURPOSE OF CONSULTATION:  Leukopenia  HISTORY OF PRESENTING ILLNESS:  Andrea Young is a wonderful 46 y.o. female who has been referred to [place] by Dr Tyson Dense for evaluation and management of leukopenia.  Patient has a history of PCOS, chronic anemia, gestational diabetes who is noted to have slightly low blood counts on routine labs with her primary care physician and was sent for hematology referral for further evaluation of her leukopenia WBC count of 2.9k.  Patient notes no fevers no chills no night sweats no unexpected weight loss.  No new lumps or bumps.  No new bone pains.  No new skin rashes.. Patient notes no other acute new focal symptoms.  No new medications recently started.  MEDICAL HISTORY:  Past Medical History:  Diagnosis Date   Anemia    history   Complication of anesthesia    Gestational diabetes    PCOS (polycystic ovarian syndrome)    treatment with metformin   PONV (postoperative nausea and vomiting)     SURGICAL HISTORY: Past Surgical History:  Procedure Laterality Date   bilateral foot surgery     CERVICAL CERCLAGE N/A 06/19/2015   Procedure: CERCLAGE CERVICAL;  Surgeon: Marcelle Overlie, MD;  Location: [facility];  Service: Gynecology;  Laterality: N/A;   CESAREAN SECTION N/A 09/26/2014   Procedure: CESAREAN SECTION;  Surgeon: Marcelle Overlie, MD;  Location: [facility];  Service: Obstetrics;  Laterality: N/A;   CESAREAN SECTION N/A 11/05/2015   Procedure: CESAREAN SECTION;  Surgeon: Mitchel Honour, DO;  [facility]: [facility];  Service: Obstetrics;  Laterality: N/A;   DILATATION & CURRETTAGE/HYSTEROSCOPY WITH RESECTOCOPE  02/03/2012   Procedure: DILATATION & CURETTAGE/HYSTEROSCOPY WITH RESECTOCOPE;  Surgeon: Leslie Andrea, MD;  Location: [facility];  Service: Gynecology;  Laterality: N/A;  no  resection done   DILATION AND CURETTAGE OF UTERUS     x 2 for polyp removal   HAND SURGERY     as a child -tendons & ligaments repair   polypectomy     WISDOM TOOTH EXTRACTION      SOCIAL HISTORY: Social History   Socioeconomic History   Marital status: Married    Spouse name: Not on file   Number of children: Not on file   Years of education: Not on file   Highest education level: Not on file  Occupational History   Not on file  Tobacco Use   Smoking status: Never   Smokeless tobacco: Never  Substance and Sexual Activity   Alcohol use: No   Drug use: No   Sexual activity: Not Currently    Birth control/protection: None  Other Topics Concern   Not on file  Social History Narrative   Not on file   Social Determinants of Health   Financial Resource Strain: Not on file  Food Insecurity: Not on file  Transportation Needs: Not on file  Physical Activity: Not on file  Stress: Not on file  Social Connections: Not on file  Intimate Partner Violence: Not on file    FAMILY HISTORY: Family History  Problem Relation Age of Onset   Diabetes Mother    Hypertension Father    Hyperlipidemia Father    Breast cancer Maternal Aunt    Breast cancer Maternal Aunt     ALLERGIES:  has No Known Allergies.  MEDICATIONS:  Current Outpatient Medications  Medication Sig Dispense Refill   acetaminophen (TYLENOL) 325 MG tablet Take 325 mg by mouth every 6 (six) hours as needed for mild pain, moderate pain or headache.      azithromycin (ZITHROMAX) 250 MG tablet Take 2 tablets by mouth on day 1, then 1 tablet daily for 4 more days. 6 tablet 0   buPROPion (WELLBUTRIN XL) 150 MG 24 hr tablet TAKE 1 TABLET BY MOUTH 2 TIMES DAILY 180 tablet 1   buPROPion (WELLBUTRIN XL) 150 MG 24 hr tablet Take 1 tablet by mouth twice a day 180 tablet 2   cholecalciferol (VITAMIN D) 1000 units tablet Take 4,000 Units by mouth daily.     docusate sodium (COLACE) 100 MG capsule Take 100 mg by mouth 2 (two)  times daily as needed for mild constipation.      Drospirenone (SLYND) 4 MG TABS TAKE 1 TABLET BY MOUTH ONCE DAILY FOR 24 DAYS DO NOT TAKE PLACEBO 84 tablet 0   fluconazole (DIFLUCAN) 150 MG tablet Take 1 tablet (150 mg total) by mouth once a week. 2 tablet 0   fluconazole (DIFLUCAN) 150 MG tablet Take 1 tablet by mouth daily 3 tablet 1   fluconazole (DIFLUCAN) 150 MG tablet Take 1 tablet by mouth now and repeat in 3 days 2 tablet 1   ibuprofen (ADVIL,MOTRIN) 600 MG tablet Take 1 tablet (600 mg total) by mouth every 6 (six) hours. (Patient not taking: Reported on 01/01/2017) 30 tablet 0   metFORMIN (GLUCOPHAGE) 500 MG tablet Take 500 mg 2 (two) times daily with a meal by mouth.     metFORMIN (GLUCOPHAGE-XR) 500 MG 24 hr tablet TAKE 2 TABLETS BY MOUTH EVERY MORNING 180 tablet 4   metroNIDAZOLE (FLAGYL) 500 MG tablet Take 1 tablet by mouth twice a day for 7 days. 14 tablet 0   nitrofurantoin, macrocrystal-monohydrate, (MACROBID) 100 MG capsule Take 1 capsule (100 mg total) by mouth 2 (two) times daily. 10 capsule 0   penciclovir (DENAVIR) 1 % cream Apply 1 application topically every 2 (two) hours as needed (for cold sores).     Prenatal Vit-Fe Fumarate-FA (PRENATAL MULTIVITAMIN) TABS tablet Take 1 tablet by mouth daily.      Semaglutide,0.25 or 0.5MG /DOS, (OZEMPIC, 0.25 OR 0.5 MG/DOSE,) 2 MG/1.5ML SOPN Inject 0.5 MG  into the skin once a week. 1.5 mL 11   Semaglutide,0.25 or 0.5MG /DOS, (OZEMPIC, 0.25 OR 0.5 MG/DOSE,) 2 MG/3ML SOPN Inject 0.5 mg into the skin once a week 3 mL 7   Semaglutide,0.25 or 0.5MG /DOS, (OZEMPIC, 0.25 OR 0.5 MG/DOSE,) 2 MG/3ML SOPN Inject 0.5 mg into the skin every 7 days. 9 mL 4   spironolactone (ALDACTONE) 100 MG tablet Take 1 tablet by mouth once a day for hirsutism 90 tablet 4   spironolactone (ALDACTONE) 50 MG tablet TAKE 1 TABLET BY MOUTH ONCE A DAY FOR HIRSUTISM 90 tablet 4   valACYclovir (VALTREX) 500 MG tablet Take 500 mg by mouth daily.     valACYclovir (VALTREX) 500  MG tablet Take 1 tablet by mouth 3 times daily for 5 days, 2 times daily for 5 days, then 1 time a day 45 tablet 4   No current facility-administered medications for this visit.    REVIEW OF SYSTEMS:    10 Point review of Systems was done is negative except as noted above.  PHYSICAL EXAMINATION: ECOG PERFORMANCE STATUS: 1 - Symptomatic but completely ambulatory  . Vitals:   10/03/21 1118  BP: 110/68  Pulse: 64  Resp: 20  Temp: (!) 97.3 F (36.3 C)  SpO2: 100%   Filed Weights   10/03/21 1118  Weight: 151 lb 9.6 oz (68.8 kg)   .Body mass index is 23.74 kg/m.  GENERAL:alert, in no acute distress and comfortable SKIN: no acute rashes, no significant lesions EYES: conjunctiva are pink and non-injected, sclera anicteric OROPHARYNX: MMM, no exudates, no oropharyngeal erythema or ulceration NECK: supple, no JVD LYMPH:  no palpable lymphadenopathy in the cervical, axillary or inguinal regions LUNGS: clear to auscultation b/l with normal respiratory effort HEART: regular rate & rhythm ABDOMEN:  normoactive bowel sounds , non tender, not distended. Extremity: no pedal edema PSYCH: alert & oriented x 3 with fluent speech NEURO: no focal motor/sensory deficits  LABORATORY DATA:  I have reviewed the data as listed  .    Latest Ref Rng & Units 11/06/2015    5:05 AM 11/05/2015    1:10 AM 11/03/2015   10:13 AM  CBC  WBC 4.0 - 10.5 K/uL 8.8  6.9  5.3   Hemoglobin 12.0 - 15.0 g/dL 9.2  93.9  9.5   Hematocrit 36.0 - 46.0 % 27.7  31.3  28.6   Platelets 150 - 400 K/uL 216  233  242     .    Latest Ref Rng & Units 10/29/2015    6:00 AM 02/03/2012    6:15 AM 12/22/2008   11:10 AM  CMP  Glucose 65 - 99 mg/dL 030  97  092   BUN 6 - 20 mg/dL 7  12  11    Creatinine 0.44 - 1.00 mg/dL  3.30  0.76   Sodium 135 - 145 mmol/L 135  135  139   Potassium 3.5 - 5.1 mmol/L 4.0  3.8  3.6   Chloride 101 - 111 mmol/L 106  100  107   CO2 22 - 32 mmol/L 21  23  27    Calcium 8.9 - 10.3  mg/dL 8.8  9.1  8.9   Total Protein 6.5 - 8.1 g/dL 6.0   7.6   Total Bilirubin 0.3 - 1.2 mg/dL 0.2   0.4   Alkaline Phos 38 - 126 U/L 69   68   AST 15 - 41 U/L 18   24   ALT 14 - 54 U/L 12   21      RADIOGRAPHIC STUDIES: I have personally reviewed the radiological images as listed and agreed with the findings in the report. No results found.  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 Differential counts not available Plan -Patient outside records reviewed in detail and confirmed with her. -She notes no new medications started in the last 6 months. -We will discuss different etiologies of leukopenia including benign ethnic neutropenia as well as other things like vitamin deficiencies, will chronic viral infections medications the chemical exposure and other bone marrow problems. -Patient does not seem to have any localizing symptoms at this time -We shall get lab work-up as noted below  Orders Placed This Encounter  Procedures   CBC with Differential/Platelet    Standing Status:   Future    Number of Occurrences:   1    Standing Expiration Date:   10/04/2022   CMP (Cancer Center only)    Standing Status:   Future    Number of Occurrences:   1    Standing Expiration Date:   10/04/2022   Ferritin    Standing  Status:   Future    Number of Occurrences:   1    Standing Expiration Date:   10/04/2022   Vitamin B12    Standing Status:   Future    Number of Occurrences:   1    Standing Expiration Date:   10/03/2022   Folate RBC    Standing Status:   Future    Number of Occurrences:   1    Standing Expiration Date:   10/04/2022   Lactate dehydrogenase    Standing Status:   Future    Number of Occurrences:   1    Standing Expiration Date:   10/04/2022   Copper, serum    Standing Status:   Future    Number of Occurrences:   1    Standing Expiration Date:   10/03/2022   Vitamin D 25 hydroxy    Standing Status:    Future    Number of Occurrences:   1    Standing Expiration Date:   10/03/2022   Save Smear for Provider Slide Review    Standing Status:   Future    Number of Occurrences:   1    Standing Expiration Date:   10/04/2022   Hepatitis C antibody    Standing Status:   Future    Number of Occurrences:   1    Standing Expiration Date:   10/04/2022   HIV Antibody (routine testing w rflx)    Standing Status:   Future    Number of Occurrences:   1    Standing Expiration Date:   10/04/2022   Follow-up Labs today Follow-up visit with Dr. Candise Che in 2 weeks  The total time spent in the appointment was 45 minutes*.  All of the patient's questions were answered with apparent satisfaction. The patient knows to call the clinic with any problems, questions or concerns.   Wyvonnia Lora MD MS AAHIVMS [facility] Hematology/Oncology Physician [facility]  .*Total Encounter Time as defined by the Centers for Medicare and Medicaid Services includes, in addition to the face-to-face time of a patient visit (documented in the note above) non-face-to-face time: obtaining and reviewing outside history, ordering and reviewing medications, tests or procedures, care coordination (communications with other health care professionals or caregivers) and documentation in the medical record.

## 2021-10-04 LAB — FOLATE RBC
Folate, Hemolysate: 273 ng/mL
Folate, RBC: 684 ng/mL (ref >498)
Hematocrit: 39.9 % (ref 34.0–46.6)

## 2021-10-07 LAB — COPPER, SERUM: Copper: 145 ug/dL (ref 80–158)

## 2021-10-24 ENCOUNTER — Other Ambulatory Visit (HOSPITAL_COMMUNITY): Payer: Self-pay

## 2021-10-24 ENCOUNTER — Inpatient Hospital Stay: Payer: 59 | Attending: Hematology | Admitting: Hematology

## 2021-10-24 ENCOUNTER — Telehealth: Payer: 59 | Admitting: Physician Assistant

## 2021-10-24 DIAGNOSIS — D72819 Decreased white blood cell count, unspecified: Secondary | ICD-10-CM | POA: Insufficient documentation

## 2021-10-24 DIAGNOSIS — R3989 Other symptoms and signs involving the genitourinary system: Secondary | ICD-10-CM

## 2021-10-24 MED ORDER — CEPHALEXIN 500 MG PO CAPS
500.0000 mg | ORAL_CAPSULE | Freq: Two times a day (BID) | ORAL | 0 refills | Status: AC
  Filled 2021-10-24: qty 14, 7d supply, fill #0

## 2021-10-24 MED ORDER — FLUCONAZOLE 150 MG PO TABS
ORAL_TABLET | ORAL | 0 refills | Status: DC
  Filled 2021-10-24: qty 2, 3d supply, fill #0

## 2021-10-24 NOTE — Progress Notes (Signed)
I have spent 5 minutes in review of e-visit questionnaire, review and updating patient chart, medical decision making and response to patient.   Lanore Renderos Cody Etrulia Zarr, PA-C    

## 2021-10-24 NOTE — Progress Notes (Signed)
E-Visit for Urinary Problems  We are sorry that you are not feeling well.  Here is how we plan to help!  Based on what you shared with me it looks like you most likely have a simple urinary tract infection.  A UTI (Urinary Tract Infection) is a bacterial infection of the bladder.  Most cases of urinary tract infections are simple to treat but a key part of your care is to encourage you to drink plenty of fluids and watch your symptoms carefully.  I have prescribed Keflex 500 mg twice a day for 7 days.  Your symptoms should gradually improve. Call us if the burning in your urine worsens, you develop worsening fever, back pain or pelvic pain or if your symptoms do not resolve after completing the antibiotic. I will send in Diflucan to have on hand, just in case of an antibiotic-induced yeast infection.   Urinary tract infections can be prevented by drinking plenty of water to keep your body hydrated.  Also be sure when you wipe, wipe from front to back and don't hold it in!  If possible, empty your bladder every 4 hours.  HOME CARE Drink plenty of fluids Compete the full course of the antibiotics even if the symptoms resolve Remember, when you need to go.go. Holding in your urine can increase the likelihood of getting a UTI! GET HELP RIGHT AWAY IF: You cannot urinate You get a high fever Worsening back pain occurs You see blood in your urine You feel sick to your stomach or throw up You feel like you are going to pass out  MAKE SURE YOU  Understand these instructions. Will watch your condition. Will get help right away if you are not doing well or get worse.   Thank you for choosing an e-visit.  Your e-visit answers were reviewed by a board certified advanced clinical practitioner to complete your personal care plan. Depending upon the condition, your plan could have included both over the counter or prescription medications.  Please review your pharmacy choice. Make sure the  pharmacy is open so you can pick up prescription now. If there is a problem, you may contact your provider through [employer] and have the prescription routed to another pharmacy.  Your safety is important to [place]. If you have drug allergies check your prescription carefully.   For the next 24 hours you can use MyChart to ask questions about today's visit, request a non-urgent call back, or ask for a work or school excuse. You will get an email in the next two days asking about your experience. I hope that your e-visit has been valuable and will speed your recovery.

## 2021-10-30 ENCOUNTER — Other Ambulatory Visit (HOSPITAL_COMMUNITY): Payer: Self-pay

## 2021-10-30 NOTE — Progress Notes (Addendum)
Marland Kitchen   HEMATOLOGY/ONCOLOGY PHONE VISIT NOTE  Date of Service: 10/24/2021  No care team member to display  CHIEF COMPLAINTS/PURPOSE OF CONSULTATION:  Leukopenia  HISTORY OF PRESENTING ILLNESS:  Andrea Young is a wonderful 46 y.o. female who has been referred to [place] by Dr Tyson Dense for evaluation and management of leukopenia.  Patient has a history of PCOS, chronic anemia, gestational diabetes who is noted to have slightly low blood counts on routine labs with her primary care physician and was sent for hematology referral for further evaluation of her leukopenia WBC count of 2.9k.  Patient notes no fevers no chills no night sweats no unexpected weight loss.  No new lumps or bumps.  No new bone pains.  No new skin rashes.. Patient notes no other acute new focal symptoms.  No new medications recently started.  INTERVAL HISTORY:  I connected with Andrea Young on 10/24/2021 at 3:30 PM EST by telephone visit and verified that I am speaking with the correct person using two identifiers.   I discussed the limitations, risks, security and privacy concerns of performing an evaluation and management service by telemedicine and the availability of in-person appointments. I also discussed with the patient that there may be a patient responsible charge related to this service. The patient expressed understanding and agreed to proceed.   Other persons participating in the visit and their role in the encounter: None  Patient's location: Home Provider's location: [facility]  Andrea Young is a 46 y.o. female who was contacted via phone for evaluation and management of leukopenia. She reports She is doing well with no new symptoms or concerns.  We discussed starting a vitamin B complex to optimize counts which she is agreeable to.   No fever, chills, night sweats. No new lumps, bumps, or lesions/rashes. No new focal bone pains. No new or unexpected weight loss. No  other new or acute focal symptoms.  Labs done 10/03/2021 were reviewed in detail.  MEDICAL HISTORY:  Past Medical History:  Diagnosis Date   Anemia    history   Complication of anesthesia    Gestational diabetes    PCOS (polycystic ovarian syndrome)    treatment with metformin   PONV (postoperative nausea and vomiting)     SURGICAL HISTORY: Past Surgical History:  Procedure Laterality Date   bilateral foot surgery     CERVICAL CERCLAGE N/A 06/19/2015   Procedure: CERCLAGE CERVICAL;  Surgeon: Marcelle Overlie, MD;  Location: [facility];  Service: Gynecology;  Laterality: N/A;   CESAREAN SECTION N/A 09/26/2014   Procedure: CESAREAN SECTION;  Surgeon: Marcelle Overlie, MD;  Location: [facility];  Service: Obstetrics;  Laterality: N/A;   CESAREAN SECTION N/A 11/05/2015   Procedure: CESAREAN SECTION;  Surgeon: Mitchel Honour, DO;  [facility];  Service: Obstetrics;  Laterality: N/A;   DILATATION & CURRETTAGE/HYSTEROSCOPY WITH RESECTOCOPE  02/03/2012   Procedure: DILATATION & CURETTAGE/HYSTEROSCOPY WITH RESECTOCOPE;  Surgeon: Leslie Andrea, MD;  Location: [facility];  Service: Gynecology;  Laterality: N/A;  no resection done   DILATION AND CURETTAGE OF UTERUS     x 2 for polyp removal   HAND SURGERY     as a child -tendons & ligaments repair   polypectomy     WISDOM TOOTH EXTRACTION      SOCIAL HISTORY: Social History   Socioeconomic History   Marital status: Divorced    Spouse name: Not on file   Number of children: Not on  file   Years of education: Not on file   Highest education level: Not on file  Occupational History   Not on file  Tobacco Use   Smoking status: Never   Smokeless tobacco: Never  Substance and Sexual Activity   Alcohol use: No   Drug use: No   Sexual activity: Not Currently    Birth control/protection: None  Other Topics Concern   Not on file  Social History Narrative   Not on file   Social Determinants of Health   Financial Resource  Strain: Not on file  Food Insecurity: Not on file  Transportation Needs: Not on file  Physical Activity: Not on file  Stress: Not on file  Social Connections: Not on file  Intimate Partner Violence: Not on file    FAMILY HISTORY: Family History  Problem Relation Age of Onset   Diabetes Mother    Hypertension Father    Hyperlipidemia Father    Breast cancer Maternal Aunt    Breast cancer Maternal Aunt     ALLERGIES:  has No Known Allergies.  MEDICATIONS:  Current Outpatient Medications  Medication Sig Dispense Refill   acetaminophen (TYLENOL) 325 MG tablet Take 325 mg by mouth every 6 (six) hours as needed for mild pain, moderate pain or headache.      buPROPion (WELLBUTRIN XL) 150 MG 24 hr tablet TAKE 1 TABLET BY MOUTH 2 TIMES DAILY 180 tablet 1   buPROPion (WELLBUTRIN XL) 150 MG 24 hr tablet Take 1 tablet by mouth twice a day 180 tablet 2   cephALEXin (KEFLEX) 500 MG capsule Take 1 capsule (500 mg total) by mouth 2 (two) times daily for 7 days. 14 capsule 0   Drospirenone (SLYND) 4 MG TABS TAKE 1 TABLET BY MOUTH ONCE DAILY FOR 24 DAYS DO NOT TAKE PLACEBO 84 tablet 0   fluconazole (DIFLUCAN) 150 MG tablet Take 1 tablet by mouth once. Repeat in 3 days if needed. 2 tablet 0   Semaglutide,0.25 or 0.5MG /DOS, (OZEMPIC, 0.25 OR 0.5 MG/DOSE,) 2 MG/1.5ML SOPN Inject 0.5 MG  into the skin once a week. 1.5 mL 11   Semaglutide,0.25 or 0.5MG /DOS, (OZEMPIC, 0.25 OR 0.5 MG/DOSE,) 2 MG/3ML SOPN Inject 0.5 mg into the skin once a week 3 mL 7   Semaglutide,0.25 or 0.5MG /DOS, (OZEMPIC, 0.25 OR 0.5 MG/DOSE,) 2 MG/3ML SOPN Inject 0.5 mg into the skin every 7 days. 9 mL 4   spironolactone (ALDACTONE) 100 MG tablet Take 1 tablet by mouth once a day for hirsutism 90 tablet 4   spironolactone (ALDACTONE) 50 MG tablet TAKE 1 TABLET BY MOUTH ONCE A DAY FOR HIRSUTISM 90 tablet 4   valACYclovir (VALTREX) 500 MG tablet Take 500 mg by mouth daily.     valACYclovir (VALTREX) 500 MG tablet Take 1 tablet by mouth  3 times daily for 5 days, 2 times daily for 5 days, then 1 time a day (Patient not taking: Reported on 10/03/2021) 45 tablet 4   No current facility-administered medications for this visit.    REVIEW OF SYSTEMS:   10 Point review of Systems was done is negative except as noted above.  PHYSICAL EXAMINATION: Telemedicine appointment  LABORATORY DATA:  I have reviewed the data as listed  .    Latest Ref Rng & Units 10/03/2021   12:06 PM 10/03/2021   12:05 PM 11/06/2015    5:05 AM  CBC  WBC 4.0 - 10.5 K/uL  3.5  8.8   Hemoglobin 12.0 - 15.0 g/dL  13.6  9.2   Hematocrit 34.0 - 46.6 % 39.9  39.4  27.7   Platelets 150 - 400 K/uL  303  216     .    Latest Ref Rng & Units 10/03/2021   12:05 PM 10/29/2015    6:00 AM 02/03/2012    6:15 AM  CMP  Glucose 70 - 99 mg/dL 71  149  97   BUN 6 - 20 mg/dL 14  7  12    Creatinine 0.44 - 1.00 mg/dL  7.02  6.37   Sodium 135 - 145 mmol/L 139  135  135   Potassium 3.5 - 5.1 mmol/L 3.8  4.0  3.8   Chloride 98 - 111 mmol/L 109  106  100   CO2 22 - 32 mmol/L 23  21  23    Calcium 8.9 - 10.3 mg/dL 9.3  8.8  9.1   Total Protein 6.5 - 8.1 g/dL 8.0  6.0    Total Bilirubin 0.3 - 1.2 mg/dL 0.6  0.2    Alkaline Phos 38 - 126 U/L 69  69    AST 15 - 41 U/L 18  18    ALT 0 - 44 U/L 20  12     . Lab Results  Component Value Date   IRON 84 10/03/2021   TIBC 343 10/03/2021   IRONPCTSAT 25 10/03/2021   (Iron and TIBC)  Lab Results  Component Value Date   FERRITIN 23 10/03/2021   B12 --- 169 Copper 145 RBC folate wnl . Lab Results  Component Value Date   LDH 141 10/03/2021     RADIOGRAPHIC STUDIES: I have personally reviewed the radiological images as listed and agreed with the findings in the report. No results found.  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 CBC done 10/03/2021 shows WBC count of 3.5k. Otherwise shows improved leukopenia and resolved  anemia. Likely related to B12 deficiency. Plan -Labs done 10/03/2021 were reviewed in detail. CMP unremarkable. Folate RBC labs WNL. Iron labs WNL with sat ratio of 25%. Ferritin is 23. B12 is low low 169. LDH is 141. Copper is 145. Vitamin D is 33.32. Hep C antibody is non reactive. HIV antibody is non reactive. -leukopenia likely from B12 deficiency. -recommended B12 SL liq 1000 mcg daily OTC B complex 1 cap po daily OTC Reasonable to consider take OTC Ferrous sulfate or Iron polysaccharide 3 times a week to optimize ferritin levels>50 F/u with PCP to adjust B12 replacement.  No orders of the defined types were placed in this encounter.  Follow-up RTC with PCP   The total time spent in the appointment was 15 minutes*.  All of the patient's questions were answered with apparent satisfaction. The patient knows to call the clinic with any problems, questions or concerns.   10/05/2021 MD MS AAHIVMS [facility] Hematology/Oncology Physician [facility]  .*Total Encounter Time as defined by the Centers for Medicare and Medicaid Services includes, in addition to the face-to-face time of a patient visit (documented in the note above) non-face-to-face time: obtaining and reviewing outside history, ordering and reviewing medications, tests or procedures, care coordination (communications with other health care professionals or caregivers) and documentation in the medical record.  I, [facility], am acting as scribe for Dr. [facility], MD.  .I have reviewed the above documentation for accuracy and completeness, and I agree with the above. Bernestine Amass MD

## 2021-11-07 DIAGNOSIS — Z01419 Encounter for gynecological examination (general) (routine) without abnormal findings: Secondary | ICD-10-CM | POA: Diagnosis not present

## 2021-11-07 DIAGNOSIS — Z1231 Encounter for screening mammogram for malignant neoplasm of breast: Secondary | ICD-10-CM | POA: Diagnosis not present

## 2021-11-26 ENCOUNTER — Other Ambulatory Visit (HOSPITAL_COMMUNITY): Payer: Self-pay

## 2021-11-26 ENCOUNTER — Other Ambulatory Visit: Payer: Self-pay

## 2021-11-26 MED ORDER — BUPROPION HCL ER (XL) 150 MG PO TB24
150.0000 mg | ORAL_TABLET | Freq: Two times a day (BID) | ORAL | 3 refills | Status: DC
  Filled 2021-11-26 – 2022-01-25 (×2): qty 180, 90d supply, fill #0
  Filled 2022-04-17: qty 180, 90d supply, fill #1
  Filled 2022-07-31: qty 180, 90d supply, fill #2
  Filled 2022-11-11: qty 180, 90d supply, fill #3

## 2021-11-27 ENCOUNTER — Other Ambulatory Visit (HOSPITAL_COMMUNITY): Payer: Self-pay

## 2021-11-27 MED ORDER — SLYND 4 MG PO TABS
1.0000 | ORAL_TABLET | Freq: Every day | ORAL | 0 refills | Status: DC
  Filled 2021-11-27: qty 84, 72d supply, fill #0

## 2021-11-28 DIAGNOSIS — Z6823 Body mass index (BMI) 23.0-23.9, adult: Secondary | ICD-10-CM | POA: Diagnosis not present

## 2021-11-28 DIAGNOSIS — Z113 Encounter for screening for infections with a predominantly sexual mode of transmission: Secondary | ICD-10-CM | POA: Diagnosis not present

## 2021-11-28 DIAGNOSIS — Z124 Encounter for screening for malignant neoplasm of cervix: Secondary | ICD-10-CM | POA: Diagnosis not present

## 2021-11-28 DIAGNOSIS — Z01419 Encounter for gynecological examination (general) (routine) without abnormal findings: Secondary | ICD-10-CM | POA: Diagnosis not present

## 2021-12-25 ENCOUNTER — Other Ambulatory Visit (HOSPITAL_COMMUNITY): Payer: Self-pay

## 2022-01-07 ENCOUNTER — Other Ambulatory Visit (HOSPITAL_COMMUNITY): Payer: Self-pay

## 2022-01-11 ENCOUNTER — Other Ambulatory Visit (HOSPITAL_COMMUNITY): Payer: Self-pay

## 2022-01-12 ENCOUNTER — Other Ambulatory Visit (HOSPITAL_COMMUNITY): Payer: Self-pay

## 2022-01-12 MED ORDER — SLYND 4 MG PO TABS
1.0000 | ORAL_TABLET | Freq: Every day | ORAL | 4 refills | Status: DC
  Filled 2022-01-12 – 2022-02-04 (×2): qty 84, 72d supply, fill #0
  Filled 2022-04-17: qty 84, 72d supply, fill #1
  Filled 2022-07-07 – 2022-07-31 (×2): qty 84, 72d supply, fill #2
  Filled 2022-11-11: qty 84, 72d supply, fill #3

## 2022-01-16 ENCOUNTER — Other Ambulatory Visit (HOSPITAL_COMMUNITY): Payer: Self-pay

## 2022-01-16 MED ORDER — VALACYCLOVIR HCL 500 MG PO TABS
ORAL_TABLET | ORAL | 0 refills | Status: AC
  Filled 2022-01-16: qty 45, 30d supply, fill #0

## 2022-01-22 ENCOUNTER — Other Ambulatory Visit (HOSPITAL_COMMUNITY): Payer: Self-pay

## 2022-01-25 ENCOUNTER — Other Ambulatory Visit (HOSPITAL_COMMUNITY): Payer: Self-pay

## 2022-01-26 ENCOUNTER — Other Ambulatory Visit (HOSPITAL_COMMUNITY): Payer: Self-pay

## 2022-01-30 ENCOUNTER — Other Ambulatory Visit (HOSPITAL_COMMUNITY): Payer: Self-pay

## 2022-02-04 ENCOUNTER — Other Ambulatory Visit (HOSPITAL_COMMUNITY): Payer: Self-pay

## 2022-02-04 ENCOUNTER — Other Ambulatory Visit: Payer: Self-pay

## 2022-02-08 ENCOUNTER — Other Ambulatory Visit (HOSPITAL_COMMUNITY): Payer: Self-pay

## 2022-03-02 ENCOUNTER — Other Ambulatory Visit (HOSPITAL_COMMUNITY): Payer: Self-pay

## 2022-03-04 ENCOUNTER — Other Ambulatory Visit (HOSPITAL_COMMUNITY): Payer: Self-pay

## 2022-03-08 ENCOUNTER — Other Ambulatory Visit (HOSPITAL_COMMUNITY): Payer: Self-pay

## 2022-03-11 ENCOUNTER — Other Ambulatory Visit (HOSPITAL_COMMUNITY): Payer: Self-pay

## 2022-03-11 MED ORDER — WEGOVY 0.5 MG/0.5ML ~~LOC~~ SOAJ
0.5000 mg | SUBCUTANEOUS | 4 refills | Status: AC
  Filled 2022-03-11: qty 2, 28d supply, fill #0
  Filled 2022-07-07: qty 6, fill #0
  Filled 2022-07-31: qty 2, 28d supply, fill #0
  Filled 2022-11-11: qty 6, 84d supply, fill #0

## 2022-03-13 ENCOUNTER — Other Ambulatory Visit: Payer: Self-pay

## 2022-03-15 DIAGNOSIS — E663 Overweight: Secondary | ICD-10-CM | POA: Diagnosis not present

## 2022-03-15 DIAGNOSIS — E282 Polycystic ovarian syndrome: Secondary | ICD-10-CM | POA: Diagnosis not present

## 2022-03-15 DIAGNOSIS — Z8639 Personal history of other endocrine, nutritional and metabolic disease: Secondary | ICD-10-CM | POA: Diagnosis not present

## 2022-03-15 DIAGNOSIS — Z6828 Body mass index (BMI) 28.0-28.9, adult: Secondary | ICD-10-CM | POA: Diagnosis not present

## 2022-03-15 DIAGNOSIS — L680 Hirsutism: Secondary | ICD-10-CM | POA: Diagnosis not present

## 2022-03-15 DIAGNOSIS — R7303 Prediabetes: Secondary | ICD-10-CM | POA: Diagnosis not present

## 2022-03-15 DIAGNOSIS — E538 Deficiency of other specified B group vitamins: Secondary | ICD-10-CM | POA: Diagnosis not present

## 2022-03-15 DIAGNOSIS — E559 Vitamin D deficiency, unspecified: Secondary | ICD-10-CM | POA: Diagnosis not present

## 2022-03-15 DIAGNOSIS — R5383 Other fatigue: Secondary | ICD-10-CM | POA: Diagnosis not present

## 2022-03-19 ENCOUNTER — Other Ambulatory Visit (HOSPITAL_COMMUNITY): Payer: Self-pay

## 2022-03-22 ENCOUNTER — Other Ambulatory Visit (HOSPITAL_COMMUNITY): Payer: Self-pay

## 2022-03-25 ENCOUNTER — Other Ambulatory Visit (HOSPITAL_COMMUNITY): Payer: Self-pay

## 2022-03-29 ENCOUNTER — Other Ambulatory Visit (HOSPITAL_COMMUNITY): Payer: Self-pay

## 2022-03-29 DIAGNOSIS — Z113 Encounter for screening for infections with a predominantly sexual mode of transmission: Secondary | ICD-10-CM | POA: Diagnosis not present

## 2022-03-29 DIAGNOSIS — N760 Acute vaginitis: Secondary | ICD-10-CM | POA: Diagnosis not present

## 2022-03-29 DIAGNOSIS — N898 Other specified noninflammatory disorders of vagina: Secondary | ICD-10-CM | POA: Diagnosis not present

## 2022-03-29 MED ORDER — METRONIDAZOLE 0.75 % VA GEL
VAGINAL | 0 refills | Status: AC
  Filled 2022-03-29: qty 70, 5d supply, fill #0

## 2022-03-29 MED ORDER — METRONIDAZOLE 0.75 % VA GEL
VAGINAL | 0 refills | Status: AC
  Filled 2022-03-29 – 2022-05-29 (×2): qty 70, 5d supply, fill #0

## 2022-04-18 ENCOUNTER — Other Ambulatory Visit (HOSPITAL_COMMUNITY): Payer: Self-pay

## 2022-04-19 ENCOUNTER — Other Ambulatory Visit (HOSPITAL_COMMUNITY): Payer: Self-pay

## 2022-04-19 MED ORDER — VALACYCLOVIR HCL 500 MG PO TABS
ORAL_TABLET | ORAL | 4 refills | Status: DC
  Filled 2022-04-19: qty 45, 30d supply, fill #0
  Filled 2022-06-28: qty 45, 30d supply, fill #1
  Filled 2022-07-31: qty 45, 30d supply, fill #2
  Filled 2022-10-02: qty 45, 30d supply, fill #3
  Filled 2022-11-11: qty 45, 30d supply, fill #4

## 2022-04-22 ENCOUNTER — Other Ambulatory Visit (HOSPITAL_COMMUNITY): Payer: Self-pay

## 2022-04-22 DIAGNOSIS — R7303 Prediabetes: Secondary | ICD-10-CM | POA: Diagnosis not present

## 2022-04-22 DIAGNOSIS — Z1389 Encounter for screening for other disorder: Secondary | ICD-10-CM | POA: Diagnosis not present

## 2022-04-22 DIAGNOSIS — D72819 Decreased white blood cell count, unspecified: Secondary | ICD-10-CM | POA: Diagnosis not present

## 2022-04-22 DIAGNOSIS — E282 Polycystic ovarian syndrome: Secondary | ICD-10-CM | POA: Diagnosis not present

## 2022-04-22 DIAGNOSIS — F419 Anxiety disorder, unspecified: Secondary | ICD-10-CM | POA: Diagnosis not present

## 2022-04-22 DIAGNOSIS — E559 Vitamin D deficiency, unspecified: Secondary | ICD-10-CM | POA: Diagnosis not present

## 2022-04-22 DIAGNOSIS — Z0000 Encounter for general adult medical examination without abnormal findings: Secondary | ICD-10-CM | POA: Diagnosis not present

## 2022-04-22 DIAGNOSIS — E538 Deficiency of other specified B group vitamins: Secondary | ICD-10-CM | POA: Diagnosis not present

## 2022-04-22 MED ORDER — VITAMIN D3 1.25 MG (50000 UT) PO CAPS
50000.0000 [IU] | ORAL_CAPSULE | ORAL | 3 refills | Status: AC
  Filled 2022-04-22: qty 12, 84d supply, fill #0
  Filled 2022-07-31: qty 12, 84d supply, fill #1
  Filled 2022-11-11: qty 12, 84d supply, fill #2
  Filled 2023-02-04: qty 12, 84d supply, fill #3

## 2022-04-24 DIAGNOSIS — R0981 Nasal congestion: Secondary | ICD-10-CM | POA: Diagnosis not present

## 2022-04-24 DIAGNOSIS — J029 Acute pharyngitis, unspecified: Secondary | ICD-10-CM | POA: Diagnosis not present

## 2022-04-24 DIAGNOSIS — U071 COVID-19: Secondary | ICD-10-CM | POA: Diagnosis not present

## 2022-04-25 ENCOUNTER — Other Ambulatory Visit (HOSPITAL_COMMUNITY): Payer: Self-pay

## 2022-04-25 MED ORDER — PAXLOVID (300/100) 20 X 150 MG & 10 X 100MG PO TBPK
ORAL_TABLET | ORAL | 0 refills | Status: AC
  Filled 2022-04-25: qty 30, 5d supply, fill #0

## 2022-04-27 ENCOUNTER — Other Ambulatory Visit (HOSPITAL_COMMUNITY): Payer: Self-pay

## 2022-05-25 ENCOUNTER — Other Ambulatory Visit (HOSPITAL_COMMUNITY): Payer: Self-pay

## 2022-05-29 ENCOUNTER — Other Ambulatory Visit (HOSPITAL_COMMUNITY): Payer: Self-pay

## 2022-06-28 ENCOUNTER — Other Ambulatory Visit: Payer: Self-pay

## 2022-06-28 ENCOUNTER — Other Ambulatory Visit (HOSPITAL_COMMUNITY): Payer: Self-pay

## 2022-07-07 ENCOUNTER — Telehealth: Payer: 59 | Admitting: Family Medicine

## 2022-07-07 ENCOUNTER — Other Ambulatory Visit (HOSPITAL_COMMUNITY): Payer: Self-pay

## 2022-07-07 DIAGNOSIS — H1033 Unspecified acute conjunctivitis, bilateral: Secondary | ICD-10-CM | POA: Diagnosis not present

## 2022-07-07 MED ORDER — POLYMYXIN B-TRIMETHOPRIM 10000-0.1 UNIT/ML-% OP SOLN: 1.0000 [drp] | OPHTHALMIC | 0 refills | Status: AC

## 2022-07-07 NOTE — Progress Notes (Signed)

## 2022-07-08 ENCOUNTER — Other Ambulatory Visit: Payer: Self-pay

## 2022-07-08 ENCOUNTER — Other Ambulatory Visit (HOSPITAL_COMMUNITY): Payer: Self-pay

## 2022-07-08 MED ORDER — ERYTHROMYCIN 5 MG/GM OP OINT
TOPICAL_OINTMENT | OPHTHALMIC | 0 refills | Status: AC
  Filled 2022-07-08: qty 3.5, 7d supply, fill #0

## 2022-07-08 MED ORDER — DOXYCYCLINE HYCLATE 100 MG PO TABS
100.0000 mg | ORAL_TABLET | Freq: Two times a day (BID) | ORAL | 0 refills | Status: AC
  Filled 2022-07-08: qty 14, 7d supply, fill #0

## 2022-07-09 ENCOUNTER — Other Ambulatory Visit (HOSPITAL_COMMUNITY): Payer: Self-pay

## 2022-07-09 MED ORDER — SPIRONOLACTONE 100 MG PO TABS
100.0000 mg | ORAL_TABLET | Freq: Every day | ORAL | 4 refills | Status: AC
  Filled 2022-07-09 – 2022-07-31 (×2): qty 90, 90d supply, fill #0
  Filled 2022-11-11: qty 90, 90d supply, fill #1
  Filled 2023-03-22: qty 90, 90d supply, fill #2

## 2022-07-18 ENCOUNTER — Ambulatory Visit
Admission: EM | Admit: 2022-07-18 | Discharge: 2022-07-18 | Disposition: A | Discharge status: Home / Self Care | Payer: 59

## 2022-07-18 ENCOUNTER — Other Ambulatory Visit (HOSPITAL_COMMUNITY): Payer: Self-pay

## 2022-07-18 DIAGNOSIS — H00021 Hordeolum internum right upper eyelid: Secondary | ICD-10-CM | POA: Diagnosis not present

## 2022-07-18 MED ORDER — FLUCONAZOLE 150 MG PO TABS
150.0000 mg | ORAL_TABLET | ORAL | 0 refills | Status: AC
  Filled 2022-07-18: qty 2, 14d supply, fill #0

## 2022-07-18 MED ORDER — CEPHALEXIN 500 MG PO CAPS
500.0000 mg | ORAL_CAPSULE | Freq: Three times a day (TID) | ORAL | 0 refills | Status: AC
  Filled 2022-07-18: qty 21, 7d supply, fill #0

## 2022-07-18 NOTE — ED Triage Notes (Signed)
Pt reports she has a stye in the right upper eyelid x 2 weeks. Pt finished erythromycin and doxycyline.

## 2022-07-18 NOTE — Discharge Instructions (Addendum)
Try to follow up with Dr. Earley Brooke to see if they are willing to excise the hordeolum or do a blepharoplasty. In the meantime, continue doing warm compresses 5-10 minutes 3-5 times a day as your schedule allows. You can use Keflex for the next week.

## 2022-07-18 NOTE — ED Provider Notes (Signed)
[facility]  Note:  This document was prepared using Conservation officer, historic buildings and may include unintentional dictation errors.  MRN: [id number] DOB: 1976-02-17  Subjective:   Andrea Young is a 47 y.o. female presenting for 2-week history of a persistent irritating right upper eyelid stye.  Patient has been doing warm compresses.  Has also been prescribed 2 rounds of antibiotics.  This is currently using a right tobramycin ointment and doxycycline.  She no longer has her eye specialist at [facility].  Has previously had this area lanced by their practice.  Was hoping for a similar procedure.  No current facility-administered medications for this encounter.  Current Outpatient Medications:    Drospirenone (SLYND PO), Take by mouth., Disp: , Rfl:    acetaminophen (TYLENOL) 325 MG tablet, Take 325 mg by mouth every 6 (six) hours as needed for mild pain, moderate pain or headache. , Disp: , Rfl:    buPROPion (WELLBUTRIN XL) 150 MG 24 hr tablet, TAKE 1 TABLET BY MOUTH 2 TIMES DAILY, Disp: 180 tablet, Rfl: 1   buPROPion (WELLBUTRIN XL) 150 MG 24 hr tablet, Take 1 tablet by mouth twice a day, Disp: 180 tablet, Rfl: 2   buPROPion (WELLBUTRIN XL) 150 MG 24 hr tablet, Take 1 tablet (150 mg total) by mouth 2 (two) times daily., Disp: 180 tablet, Rfl: 3   Cholecalciferol (VITAMIN D3) 1.25 MG (50000 UT) capsule, Take 1 capsule by mouth once a week., Disp: 12 capsule, Rfl: 3   doxycycline (VIBRA-TABS) 100 MG tablet, Take 1 tablet (100 mg total) by mouth 2 (two) times daily for 7 days, Disp: 14 tablet, Rfl: 0   Drospirenone (SLYND) 4 MG TABS, Take 1 tablet by mouth daily for 24 days. Do not take placebo., Disp: 84 tablet, Rfl: 4   erythromycin ophthalmic ointment, Apply a thin ribbon to affected eye(s) 2 times a day, Disp: 3.5 g, Rfl: 0   fluconazole (DIFLUCAN) 150 MG tablet, Take 1 tablet by mouth once. Repeat in 3 days if needed., Disp: 2 tablet, Rfl: 0    metroNIDAZOLE (METROGEL) 0.75 % vaginal gel, Insert 1 applicatorful vaginally every evening for 5 nights, Disp: 70 g, Rfl: 0   metroNIDAZOLE (METROGEL) 0.75 % vaginal gel, Insert 1 applicatorful vaginally every evening for 5 nights, Disp: 70 g, Rfl: 0   nirmatrelvir & ritonavir (PAXLOVID, 300/100,) 20 x 150 MG & 10 x 100MG  TBPK, Take 3 tablets by mouth twice a day for  5 days, Disp: 30 tablet, Rfl: 0   Semaglutide,0.25 or 0.5MG /DOS, (OZEMPIC, 0.25 OR 0.5 MG/DOSE,) 2 MG/1.5ML SOPN, Inject 0.5 MG  into the skin once a week., Disp: 1.5 mL, Rfl: 11   Semaglutide,0.25 or 0.5MG /DOS, (OZEMPIC, 0.25 OR 0.5 MG/DOSE,) 2 MG/3ML SOPN, Inject 0.5 mg into the skin once a week, Disp: 3 mL, Rfl: 7   Semaglutide,0.25 or 0.5MG /DOS, (OZEMPIC, 0.25 OR 0.5 MG/DOSE,) 2 MG/3ML SOPN, Inject 0.5 mg into the skin every 7 days., Disp: 9 mL, Rfl: 4   Semaglutide-Weight Management (WEGOVY) 0.5 MG/0.5ML SOAJ, Inject 0.5 mg into the skin every 7 (seven) days., Disp: 6 mL, Rfl: 4   spironolactone (ALDACTONE) 100 MG tablet, Take 1 tablet by mouth once a day for hirsutism, Disp: 90 tablet, Rfl: 4   spironolactone (ALDACTONE) 50 MG tablet, TAKE 1 TABLET BY MOUTH ONCE A DAY FOR HIRSUTISM, Disp: 90 tablet, Rfl: 4   valACYclovir (VALTREX) 500 MG tablet, Take 500 mg by mouth daily., Disp: , Rfl:  valACYclovir (VALTREX) 500 MG tablet, Take 1 tablet by mouth 3 times daily for 5 days, then 2 times daily for 5 days, then once daily, Disp: 45 tablet, Rfl: 4   No Known Allergies  Past Medical History:  Diagnosis Date   Anemia    history   Complication of anesthesia    Gestational diabetes    PCOS (polycystic ovarian syndrome)    treatment with metformin   PONV (postoperative nausea and vomiting)      Past Surgical History:  Procedure Laterality Date   bilateral foot surgery     CERVICAL CERCLAGE N/A 06/19/2015   Procedure: CERCLAGE CERVICAL;  Surgeon: Marcelle Overlie, MD;  Location: [facility];  Service: Gynecology;  Laterality: N/A;    CESAREAN SECTION N/A 09/26/2014   Procedure: CESAREAN SECTION;  Surgeon: Marcelle Overlie, MD;  Location: [facility];  Service: Obstetrics;  Laterality: N/A;   CESAREAN SECTION N/A 11/05/2015   Procedure: CESAREAN SECTION;  Surgeon: Mitchel Honour, DO;  Location: [facility];  Service: Obstetrics;  Laterality: N/A;   DILATATION & CURRETTAGE/HYSTEROSCOPY WITH RESECTOCOPE  02/03/2012   Procedure: DILATATION & CURETTAGE/HYSTEROSCOPY WITH RESECTOCOPE;  Surgeon: Leslie Andrea, MD;  Location: [facility];  Service: Gynecology;  Laterality: N/A;  no resection done   DILATION AND CURETTAGE OF UTERUS     x 2 for polyp removal   HAND SURGERY     as a child -tendons & ligaments repair   polypectomy     WISDOM TOOTH EXTRACTION      Family History  Problem Relation Age of Onset   Diabetes Mother    Hypertension Father    Hyperlipidemia Father    Breast cancer Maternal Aunt    Breast cancer Maternal Aunt     Social History   Tobacco Use   Smoking status: Never   Smokeless tobacco: Never  Vaping Use   Vaping Use: Never used  Substance Use Topics   Alcohol use: Yes    Comment: Occa   Drug use: No    ROS   Objective:   Vitals: BP 114/77 (BP Location: Right Arm)   Pulse 77   Temp 98.2 F (36.8 C) (Oral)   Resp 18   SpO2 99%   Breastfeeding No   Physical Exam Constitutional:      General: She is not in acute distress.    Appearance: Normal appearance. She is well-developed. She is not ill-appearing, toxic-appearing or diaphoretic.  HENT:     Head: Normocephalic and atraumatic.     Nose: Nose normal.     Mouth/Throat:     Mouth: Mucous membranes are moist.  Eyes:     General: Lids are everted, no foreign bodies appreciated. Vision grossly intact. No scleral icterus.       Right eye: Hordeolum (internum, mid upper eyelid) present. No foreign body or discharge.        Left eye: No foreign body, discharge or hordeolum.     Extraocular Movements: Extraocular movements intact.      Right eye: Normal extraocular motion.     Left eye: Normal extraocular motion and no nystagmus.     Conjunctiva/sclera:     Right eye: Right conjunctiva is not injected. No chemosis, exudate or hemorrhage.    Left eye: Left conjunctiva is not injected. No chemosis, exudate or hemorrhage. Cardiovascular:     Rate and Rhythm: Normal rate.  Pulmonary:     Effort: Pulmonary effort is normal.  Skin:    General: Skin  is warm and dry.  Neurological:     General: No focal deficit present.     Mental Status: She is alert and oriented to person, place, and time.  Psychiatric:        Mood and Affect: Mood normal.        Behavior: Behavior normal.     Assessment and Plan :   PDMP not reviewed this encounter.  1. Hordeolum internum of right upper eyelid    Unfortunately I cannot perform a blepharoplasty or incision and drainage of hordeolum internum.  Patient was very agreeable with this.  I offered cephalexin which the patient would like to trial.  Continue warm compresses.  Provided her with information to [facility] for follow-up and consideration for blepharoplasty or referral to a plastic surgeon.  Patient will use fluconazole for antibiotic associated yeast infection.  Counseled patient on potential for adverse effects with medications prescribed/recommended today, ER and return-to-clinic precautions discussed, patient verbalized understanding.    Wallis Bamberg, [place] 07/18/22 1610

## 2022-07-23 DIAGNOSIS — H00021 Hordeolum internum right upper eyelid: Secondary | ICD-10-CM | POA: Diagnosis not present

## 2022-07-23 DIAGNOSIS — H179 Unspecified corneal scar and opacity: Secondary | ICD-10-CM | POA: Diagnosis not present

## 2022-07-24 DIAGNOSIS — N898 Other specified noninflammatory disorders of vagina: Secondary | ICD-10-CM | POA: Diagnosis not present

## 2022-07-24 DIAGNOSIS — Z113 Encounter for screening for infections with a predominantly sexual mode of transmission: Secondary | ICD-10-CM | POA: Diagnosis not present

## 2022-07-30 ENCOUNTER — Other Ambulatory Visit (HOSPITAL_COMMUNITY): Payer: Self-pay

## 2022-07-30 DIAGNOSIS — H00021 Hordeolum internum right upper eyelid: Secondary | ICD-10-CM | POA: Diagnosis not present

## 2022-07-30 MED ORDER — OFLOXACIN 0.3 % OP SOLN
1.0000 [drp] | Freq: Four times a day (QID) | OPHTHALMIC | 0 refills | Status: AC
  Filled 2022-07-30: qty 5, 14d supply, fill #0

## 2022-07-31 ENCOUNTER — Other Ambulatory Visit: Payer: Self-pay

## 2022-07-31 ENCOUNTER — Other Ambulatory Visit (HOSPITAL_COMMUNITY): Payer: Self-pay

## 2022-08-01 ENCOUNTER — Other Ambulatory Visit (HOSPITAL_COMMUNITY): Payer: Self-pay

## 2022-08-01 ENCOUNTER — Other Ambulatory Visit: Payer: Self-pay

## 2022-10-03 ENCOUNTER — Other Ambulatory Visit (HOSPITAL_COMMUNITY): Payer: Self-pay

## 2022-11-11 ENCOUNTER — Other Ambulatory Visit: Payer: Self-pay

## 2022-11-11 ENCOUNTER — Other Ambulatory Visit (HOSPITAL_COMMUNITY): Payer: Self-pay

## 2022-11-12 ENCOUNTER — Other Ambulatory Visit: Payer: Self-pay

## 2022-11-13 ENCOUNTER — Other Ambulatory Visit (HOSPITAL_COMMUNITY): Payer: Self-pay

## 2023-01-02 ENCOUNTER — Other Ambulatory Visit (HOSPITAL_COMMUNITY): Payer: Self-pay

## 2023-01-02 DIAGNOSIS — Z113 Encounter for screening for infections with a predominantly sexual mode of transmission: Secondary | ICD-10-CM | POA: Diagnosis not present

## 2023-01-02 DIAGNOSIS — Z124 Encounter for screening for malignant neoplasm of cervix: Secondary | ICD-10-CM | POA: Diagnosis not present

## 2023-01-02 MED ORDER — SLYND 4 MG PO TABS
ORAL_TABLET | ORAL | 3 refills | Status: DC
  Filled 2023-01-02 – 2023-01-08 (×2): qty 84, 72d supply, fill #0
  Filled 2023-02-04 – 2023-04-16 (×4): qty 84, 72d supply, fill #1
  Filled 2023-06-14 – 2023-07-28 (×3): qty 84, 72d supply, fill #2
  Filled 2023-10-04: qty 84, 72d supply, fill #3

## 2023-01-08 ENCOUNTER — Other Ambulatory Visit (HOSPITAL_COMMUNITY): Payer: Self-pay

## 2023-01-09 ENCOUNTER — Other Ambulatory Visit (HOSPITAL_COMMUNITY): Payer: Self-pay

## 2023-01-09 MED ORDER — VALACYCLOVIR HCL 500 MG PO TABS
ORAL_TABLET | ORAL | 4 refills | Status: DC
  Filled 2023-01-09: qty 45, 30d supply, fill #0
  Filled 2023-02-04: qty 45, 30d supply, fill #1
  Filled 2023-03-22: qty 45, 30d supply, fill #2
  Filled 2023-04-25: qty 45, 30d supply, fill #3
  Filled 2023-06-14: qty 45, 30d supply, fill #4

## 2023-02-04 ENCOUNTER — Other Ambulatory Visit (HOSPITAL_COMMUNITY): Payer: Self-pay

## 2023-02-04 ENCOUNTER — Other Ambulatory Visit: Payer: Self-pay

## 2023-02-04 MED ORDER — BUPROPION HCL ER (XL) 150 MG PO TB24
300.0000 mg | ORAL_TABLET | Freq: Every day | ORAL | 0 refills | Status: DC
  Filled 2023-02-04: qty 180, 90d supply, fill #0

## 2023-02-13 ENCOUNTER — Other Ambulatory Visit: Payer: Self-pay

## 2023-02-13 ENCOUNTER — Other Ambulatory Visit (HOSPITAL_COMMUNITY): Payer: Self-pay

## 2023-03-06 ENCOUNTER — Other Ambulatory Visit: Payer: Self-pay

## 2023-03-17 ENCOUNTER — Other Ambulatory Visit (HOSPITAL_COMMUNITY): Payer: Self-pay

## 2023-03-24 ENCOUNTER — Other Ambulatory Visit (HOSPITAL_COMMUNITY): Payer: Self-pay

## 2023-04-09 ENCOUNTER — Other Ambulatory Visit: Payer: Self-pay

## 2023-04-10 ENCOUNTER — Other Ambulatory Visit: Payer: Self-pay

## 2023-04-11 ENCOUNTER — Other Ambulatory Visit (HOSPITAL_COMMUNITY): Payer: Self-pay

## 2023-04-16 ENCOUNTER — Other Ambulatory Visit: Payer: Self-pay

## 2023-04-24 ENCOUNTER — Other Ambulatory Visit (HOSPITAL_COMMUNITY): Payer: Self-pay

## 2023-04-24 DIAGNOSIS — Z13220 Encounter for screening for lipoid disorders: Secondary | ICD-10-CM | POA: Diagnosis not present

## 2023-04-24 DIAGNOSIS — E559 Vitamin D deficiency, unspecified: Secondary | ICD-10-CM | POA: Diagnosis not present

## 2023-04-24 DIAGNOSIS — E663 Overweight: Secondary | ICD-10-CM | POA: Diagnosis not present

## 2023-04-24 DIAGNOSIS — E282 Polycystic ovarian syndrome: Secondary | ICD-10-CM | POA: Diagnosis not present

## 2023-04-24 MED ORDER — ZEPBOUND 2.5 MG/0.5ML ~~LOC~~ SOAJ
2.5000 mg | SUBCUTANEOUS | 1 refills | Status: AC
  Filled 2023-04-24 – 2023-07-28 (×3): qty 2, 28d supply, fill #0

## 2023-04-25 ENCOUNTER — Other Ambulatory Visit (HOSPITAL_COMMUNITY): Payer: Self-pay

## 2023-06-14 ENCOUNTER — Other Ambulatory Visit (HOSPITAL_COMMUNITY): Payer: Self-pay

## 2023-06-15 ENCOUNTER — Other Ambulatory Visit: Payer: Self-pay

## 2023-06-17 ENCOUNTER — Other Ambulatory Visit: Payer: Self-pay

## 2023-06-17 ENCOUNTER — Other Ambulatory Visit (HOSPITAL_COMMUNITY): Payer: Self-pay

## 2023-06-17 MED ORDER — BUPROPION HCL ER (XL) 150 MG PO TB24
300.0000 mg | ORAL_TABLET | Freq: Every day | ORAL | 0 refills | Status: DC
  Filled 2023-06-17: qty 180, 90d supply, fill #0

## 2023-06-18 ENCOUNTER — Other Ambulatory Visit (HOSPITAL_COMMUNITY): Payer: Self-pay

## 2023-06-24 ENCOUNTER — Other Ambulatory Visit (HOSPITAL_COMMUNITY): Payer: Self-pay

## 2023-06-27 ENCOUNTER — Other Ambulatory Visit (HOSPITAL_COMMUNITY): Payer: Self-pay

## 2023-07-02 ENCOUNTER — Other Ambulatory Visit: Payer: Self-pay

## 2023-07-03 ENCOUNTER — Other Ambulatory Visit: Payer: Self-pay

## 2023-07-08 ENCOUNTER — Other Ambulatory Visit: Payer: Self-pay

## 2023-07-08 ENCOUNTER — Other Ambulatory Visit (HOSPITAL_COMMUNITY): Payer: Self-pay

## 2023-07-08 ENCOUNTER — Encounter (HOSPITAL_COMMUNITY): Payer: Self-pay

## 2023-07-08 MED ORDER — SLYND 4 MG PO TABS
1.0000 | ORAL_TABLET | Freq: Every day | ORAL | 1 refills | Status: AC
  Filled 2023-07-08: qty 84, 72d supply, fill #0
  Filled 2023-09-08: qty 84, 84d supply, fill #0

## 2023-07-13 ENCOUNTER — Other Ambulatory Visit: Payer: Self-pay

## 2023-07-24 ENCOUNTER — Other Ambulatory Visit (HOSPITAL_COMMUNITY): Payer: Self-pay

## 2023-07-28 ENCOUNTER — Other Ambulatory Visit (HOSPITAL_COMMUNITY): Payer: Self-pay

## 2023-07-28 ENCOUNTER — Other Ambulatory Visit (HOSPITAL_BASED_OUTPATIENT_CLINIC_OR_DEPARTMENT_OTHER): Payer: Self-pay

## 2023-07-28 ENCOUNTER — Other Ambulatory Visit: Payer: Self-pay

## 2023-07-29 ENCOUNTER — Other Ambulatory Visit (HOSPITAL_COMMUNITY): Payer: Self-pay

## 2023-07-29 ENCOUNTER — Other Ambulatory Visit: Payer: Self-pay

## 2023-07-31 ENCOUNTER — Other Ambulatory Visit (HOSPITAL_COMMUNITY): Payer: Self-pay

## 2023-09-08 ENCOUNTER — Other Ambulatory Visit: Payer: Self-pay

## 2023-09-08 ENCOUNTER — Other Ambulatory Visit (HOSPITAL_COMMUNITY): Payer: Self-pay

## 2023-09-08 MED ORDER — VALACYCLOVIR HCL 500 MG PO TABS
ORAL_TABLET | ORAL | 4 refills | Status: AC
  Filled 2023-09-08: qty 45, 30d supply, fill #0
  Filled 2023-10-04: qty 45, 30d supply, fill #1
  Filled 2024-01-01: qty 45, 30d supply, fill #2
  Filled 2024-01-26: qty 45, 30d supply, fill #3
  Filled 2024-03-23: qty 45, 30d supply, fill #4

## 2023-09-08 MED ORDER — BUPROPION HCL ER (XL) 150 MG PO TB24
300.0000 mg | ORAL_TABLET | Freq: Every day | ORAL | 0 refills | Status: AC
  Filled 2023-09-08: qty 180, 90d supply, fill #0

## 2023-10-04 ENCOUNTER — Other Ambulatory Visit (HOSPITAL_COMMUNITY): Payer: Self-pay

## 2024-01-01 ENCOUNTER — Other Ambulatory Visit: Payer: Self-pay

## 2024-01-01 ENCOUNTER — Encounter: Payer: Self-pay | Admitting: Pharmacist

## 2024-01-01 ENCOUNTER — Other Ambulatory Visit (HOSPITAL_COMMUNITY): Payer: Self-pay

## 2024-01-01 MED ORDER — SLYND 4 MG PO TABS
1.0000 | ORAL_TABLET | Freq: Every day | ORAL | 0 refills | Status: AC
  Filled 2024-01-01: qty 84, 63d supply, fill #0
  Filled 2024-01-05: qty 84, 84d supply, fill #0
  Filled 2024-01-05: qty 84, 72d supply, fill #0
  Filled 2024-01-05 – 2024-01-06 (×2): qty 84, 84d supply, fill #0

## 2024-01-05 ENCOUNTER — Other Ambulatory Visit (HOSPITAL_COMMUNITY): Payer: Self-pay

## 2024-01-06 ENCOUNTER — Other Ambulatory Visit: Payer: Self-pay

## 2024-01-06 ENCOUNTER — Other Ambulatory Visit (HOSPITAL_COMMUNITY): Payer: Self-pay

## 2024-01-26 ENCOUNTER — Other Ambulatory Visit (HOSPITAL_COMMUNITY): Payer: Self-pay

## 2024-03-19 ENCOUNTER — Other Ambulatory Visit: Payer: Self-pay

## 2024-03-23 ENCOUNTER — Other Ambulatory Visit (HOSPITAL_COMMUNITY): Payer: Self-pay
